# Patient Record
Sex: Male | Born: 1960 | Race: White | Hispanic: No | Marital: Married | Smoking: Former smoker
Health system: Southern US, Community
[De-identification: ages and names within clinical notes are randomized; demographics above are authoritative.]

## PROBLEM LIST (undated history)

## (undated) DIAGNOSIS — C801 Malignant (primary) neoplasm, unspecified: Secondary | ICD-10-CM

## (undated) DIAGNOSIS — R569 Unspecified convulsions: Secondary | ICD-10-CM

## (undated) DIAGNOSIS — I10 Essential (primary) hypertension: Secondary | ICD-10-CM

## (undated) HISTORY — PX: BRAIN SURGERY: SHX531

## (undated) HISTORY — PX: RETINAL DETACHMENT SURGERY: SHX105

## (undated) HISTORY — PX: ABDOMINAL SURGERY: SHX537

## (undated) HISTORY — PX: OTHER SURGICAL HISTORY: SHX169

---

## 1998-03-26 ENCOUNTER — Ambulatory Visit (HOSPITAL_COMMUNITY): Admission: RE | Admit: 1998-03-26 | Discharge: 1998-03-26 | Payer: Self-pay | Admitting: Gynecology

## 2005-05-16 ENCOUNTER — Encounter: Admission: RE | Admit: 2005-05-16 | Discharge: 2005-05-16 | Payer: Self-pay | Admitting: Neurology

## 2008-09-20 ENCOUNTER — Emergency Department (HOSPITAL_BASED_OUTPATIENT_CLINIC_OR_DEPARTMENT_OTHER): Admission: EM | Admit: 2008-09-20 | Discharge: 2008-09-20 | Payer: Self-pay | Admitting: Emergency Medicine

## 2008-09-20 ENCOUNTER — Ambulatory Visit: Payer: Self-pay | Admitting: Diagnostic Radiology

## 2008-09-22 ENCOUNTER — Telehealth: Payer: Self-pay | Admitting: Critical Care Medicine

## 2008-09-23 ENCOUNTER — Encounter
Admission: RE | Admit: 2008-09-23 | Discharge: 2008-09-23 | Payer: Self-pay | Admitting: Thoracic Surgery (Cardiothoracic Vascular Surgery)

## 2008-09-23 ENCOUNTER — Ambulatory Visit: Payer: Self-pay | Admitting: Thoracic Surgery (Cardiothoracic Vascular Surgery)

## 2008-09-23 ENCOUNTER — Telehealth (INDEPENDENT_AMBULATORY_CARE_PROVIDER_SITE_OTHER): Payer: Self-pay | Admitting: *Deleted

## 2008-10-09 ENCOUNTER — Other Ambulatory Visit
Admission: RE | Admit: 2008-10-09 | Discharge: 2008-10-09 | Payer: Self-pay | Admitting: Thoracic Surgery (Cardiothoracic Vascular Surgery)

## 2008-10-09 ENCOUNTER — Encounter
Admission: RE | Admit: 2008-10-09 | Discharge: 2008-10-09 | Payer: Self-pay | Admitting: Thoracic Surgery (Cardiothoracic Vascular Surgery)

## 2008-10-09 ENCOUNTER — Encounter: Payer: Self-pay | Admitting: Thoracic Surgery (Cardiothoracic Vascular Surgery)

## 2008-10-09 ENCOUNTER — Ambulatory Visit: Payer: Self-pay | Admitting: Thoracic Surgery (Cardiothoracic Vascular Surgery)

## 2008-10-16 ENCOUNTER — Encounter
Admission: RE | Admit: 2008-10-16 | Discharge: 2008-10-16 | Payer: Self-pay | Admitting: Thoracic Surgery (Cardiothoracic Vascular Surgery)

## 2008-10-16 ENCOUNTER — Ambulatory Visit: Payer: Self-pay | Admitting: Thoracic Surgery (Cardiothoracic Vascular Surgery)

## 2008-10-22 ENCOUNTER — Ambulatory Visit (HOSPITAL_COMMUNITY)
Admission: RE | Admit: 2008-10-22 | Discharge: 2008-10-22 | Payer: Self-pay | Admitting: Thoracic Surgery (Cardiothoracic Vascular Surgery)

## 2008-11-07 ENCOUNTER — Ambulatory Visit: Payer: Self-pay | Admitting: Thoracic Surgery (Cardiothoracic Vascular Surgery)

## 2008-11-07 ENCOUNTER — Encounter
Admission: RE | Admit: 2008-11-07 | Discharge: 2008-11-07 | Payer: Self-pay | Admitting: Thoracic Surgery (Cardiothoracic Vascular Surgery)

## 2008-11-20 ENCOUNTER — Encounter: Admission: RE | Admit: 2008-11-20 | Discharge: 2008-11-20 | Payer: Self-pay | Admitting: Nephrology

## 2008-11-21 ENCOUNTER — Ambulatory Visit: Payer: Self-pay | Admitting: Thoracic Surgery (Cardiothoracic Vascular Surgery)

## 2010-04-29 NOTE — Progress Notes (Signed)
Summary: appt  Phone Note Call from Patient Call back at [phone]   Caller: Patient Call For: wright Summary of Call: pt was in h/p office this weekend was diag with plural effussion. told to sch appt with dr Delford Field this week . dr Delford Field spoke with dr Patrica Duel  Initial call taken by: Rickard Patience,  September 22, 2008 3:45 PM  Follow-up for Phone Call        I added this pt to the schedule in [facility] on Thurs. I used the 9:30 and blocked the 9:45 for more time to give you 30 minutes. The pt was under the impression that he needed to be seen earlier this week, per your conversation with Dr. Patrica Duel. Please let me know. Otherwise, the pt will see you in [facility] onThurs. Follow-up by: Michel Bickers CMA,  September 22, 2008 4:11 PM  Additional Follow-up for Phone Call Additional follow up Details #1::        I told EDP any MD could have seen this pt can he be added to any MDs consult slot  it does not have to be me  Additional Follow-up by: Storm Frisk MD,  September 22, 2008 5:43 PM    Additional Follow-up for Phone Call Additional follow up Details #2::    No other MD has anything sooner than thurs so will leave appt. with PW. Follow-up by: Vernie Murders,  September 23, 2008 8:53 AM

## 2010-04-29 NOTE — Progress Notes (Signed)
Summary: appt  Phone Note Call from Patient Call back at Home Phone [phone]   Caller: Spouse Call For: wright Summary of Call: called back to check and see if anyone could see him sooner.  Nurse never called pt or wife back from yesterday. Initial call taken by: Eugene Gavia,  September 23, 2008 1:56 PM  Follow-up for Phone Call        There are no available appts before Thurs, 09/25/08 with any of our providers. The pt is scheduled to see Dr. Dorris Fetch today and if he feels this pt needs to be worked in sooner than Thurs, I have asked that they have Dr. Dorris Fetch call our office to speak with one of our providers. Follow-up by: Michel Bickers CMA,  September 23, 2008 2:37 PM

## 2010-07-04 LAB — BLOOD GAS, ARTERIAL
Acid-Base Excess: 0.7 mmol/L (ref 0.0–2.0)
Bicarbonate: 24.3 mEq/L — ABNORMAL HIGH (ref 20.0–24.0)
Drawn by: 206361
FIO2: 0.21 %
O2 Saturation: 97.8 %
Patient temperature: 98.6
TCO2: 25.4 mmol/L (ref 0–100)
pCO2 arterial: 35.8 mmHg (ref 35.0–45.0)
pH, Arterial: 7.448 (ref 7.350–7.450)
pO2, Arterial: 87.7 mmHg (ref 80.0–100.0)

## 2010-07-04 LAB — URINALYSIS, ROUTINE W REFLEX MICROSCOPIC
Bilirubin Urine: NEGATIVE
Glucose, UA: NEGATIVE mg/dL
Hgb urine dipstick: NEGATIVE
Ketones, ur: NEGATIVE mg/dL
Nitrite: NEGATIVE
Protein, ur: NEGATIVE mg/dL
Specific Gravity, Urine: 1.023 (ref 1.005–1.030)
Urobilinogen, UA: 0.2 mg/dL (ref 0.0–1.0)
pH: 5.5 (ref 5.0–8.0)

## 2010-07-04 LAB — COMPREHENSIVE METABOLIC PANEL
ALT: 17 U/L (ref 0–53)
AST: 15 U/L (ref 0–37)
Albumin: 3.5 g/dL (ref 3.5–5.2)
Alkaline Phosphatase: 100 U/L (ref 39–117)
BUN: 13 mg/dL (ref 6–23)
CO2: 22 mEq/L (ref 19–32)
Calcium: 9.3 mg/dL (ref 8.4–10.5)
Chloride: 107 mEq/L (ref 96–112)
Creatinine, Ser: 0.95 mg/dL (ref 0.4–1.5)
GFR calc Af Amer: >60 mL/min (ref >60)
GFR calc non Af Amer: >60 mL/min (ref >60)
Glucose, Bld: 102 mg/dL — ABNORMAL HIGH (ref 70–99)
Potassium: 3.4 mEq/L — ABNORMAL LOW (ref 3.5–5.1)
Sodium: 138 mEq/L (ref 135–145)
Total Bilirubin: 0.7 mg/dL (ref 0.3–1.2)
Total Protein: 6.6 g/dL (ref 6.0–8.3)

## 2010-07-04 LAB — CBC
HCT: 42.8 % (ref 39.0–52.0)
Hemoglobin: 14.2 g/dL (ref 13.0–17.0)
MCHC: 33.1 g/dL (ref 30.0–36.0)
MCV: 87.5 fL (ref 78.0–100.0)
Platelets: 365 10*3/uL (ref 150–400)
RBC: 4.89 MIL/uL (ref 4.22–5.81)
RDW: 13.1 % (ref 11.5–15.5)
WBC: 8 10*3/uL (ref 4.0–10.5)

## 2010-07-04 LAB — TYPE AND SCREEN
ABO/RH(D): O POS
Antibody Screen: NEGATIVE

## 2010-07-04 LAB — PROTIME-INR
INR: 0.9 (ref 0.00–1.49)
Prothrombin Time: 12.5 seconds (ref 11.6–15.2)

## 2010-07-04 LAB — APTT: aPTT: 28 seconds (ref 24–37)

## 2010-07-04 LAB — ABO/RH: ABO/RH(D): O POS

## 2010-07-05 LAB — DIFFERENTIAL
Basophils Absolute: 0.1 10*3/uL (ref 0.0–0.1)
Basophils Relative: 1 % (ref 0–1)
Eosinophils Absolute: 0.3 10*3/uL (ref 0.0–0.7)
Eosinophils Relative: 3 % (ref 0–5)
Lymphocytes Relative: 24 % (ref 12–46)
Lymphs Abs: 1.9 10*3/uL (ref 0.7–4.0)
Monocytes Absolute: 0.8 10*3/uL (ref 0.1–1.0)
Monocytes Relative: 10 % (ref 3–12)
Neutro Abs: 4.8 10*3/uL (ref 1.7–7.7)
Neutrophils Relative %: 62 % (ref 43–77)

## 2010-07-05 LAB — COMPREHENSIVE METABOLIC PANEL
ALT: 8 U/L (ref 0–53)
AST: 23 U/L (ref 0–37)
Albumin: 4.3 g/dL (ref 3.5–5.2)
Alkaline Phosphatase: 149 U/L — ABNORMAL HIGH (ref 39–117)
BUN: 12 mg/dL (ref 6–23)
CO2: 31 mEq/L (ref 19–32)
Calcium: 9.7 mg/dL (ref 8.4–10.5)
Chloride: 102 mEq/L (ref 96–112)
Creatinine, Ser: 1 mg/dL (ref 0.4–1.5)
GFR calc Af Amer: >60 mL/min (ref >60)
GFR calc non Af Amer: >60 mL/min (ref >60)
Glucose, Bld: 83 mg/dL (ref 70–99)
Potassium: 4.4 mEq/L (ref 3.5–5.1)
Sodium: 144 mEq/L (ref 135–145)
Total Bilirubin: 0.5 mg/dL (ref 0.3–1.2)
Total Protein: 8.1 g/dL (ref 6.0–8.3)

## 2010-07-05 LAB — CBC
HCT: 43.8 % (ref 39.0–52.0)
Hemoglobin: 14.6 g/dL (ref 13.0–17.0)
MCHC: 33.2 g/dL (ref 30.0–36.0)
MCV: 87.9 fL (ref 78.0–100.0)
Platelets: 414 10*3/uL — ABNORMAL HIGH (ref 150–400)
RBC: 4.99 MIL/uL (ref 4.22–5.81)
RDW: 12.1 % (ref 11.5–15.5)
WBC: 7.9 10*3/uL (ref 4.0–10.5)

## 2010-07-05 LAB — POCT CARDIAC MARKERS
CKMB, poc: <1 ng/mL — ABNORMAL LOW (ref 1.0–8.0)
Myoglobin, poc: 74.5 ng/mL (ref 12–200)
Troponin i, poc: <0.05 ng/mL (ref 0.00–0.09)

## 2010-08-10 NOTE — Assessment & Plan Note (Signed)
OFFICE VISIT   [name], [name]  DOB:  14-Jun-1960                                        October 16, 2008  CHART #:  [id number]   The patient is a 50 year old gentleman who I first saw in the office on  June 29.  He had presented to the emergency room at [facility]  complaining of chest pain that is pleuritic in nature, he had a right  pleural effusion.  We treated him with antibiotics and nonsteroidal anti-  inflammatories.  He had some improvement initially and then started  feeling more congested.  I saw him back in the office last week and his  effusion was essentially unchanged or to slightly larger.  We did a  thoracentesis, we got about 300 mL of fluid and then gave him a steroid  taper from 25 to 5 mg over 5 days.  He subsequently had significant  improvement of symptoms once again, but then yesterday, he noticed  increasing pain and some shortness of breath but primarily increasing  discomfort.  He has not had any fevers, chills or sweats.   PHYSICAL EXAMINATION:  GENERAL:  The patient is a 50 year old white male  in no acute distress.  VITAL SIGNS:  Blood pressure is 132/82, pulse 89, respirations 18, his  oxygen saturation is 95% on room air.  LUNGS:  Diminished breath sounds at the right base.  No wheezing.   Chest x-ray is reviewed, it shows partial reaccumulation of pleural  fluid.  There was some very slight decrease in size from the pre to  postthoracentesis films on the 15th and there has been a slight increase  in size since the post thoracentesis film.   LABORATORY DATA:  Cytology showed acute inflammation.  No malignancy.  White count is 6000 with 75% neutrophils.  Glucose is 45, protein is  5.3, LDH was 640.  Cultures were no growth.   IMPRESSION:  The patient is a 50 year old gentleman with persistent  exudative inflammatory right pleural effusion, this is partially  loculated and I do not think additional thoracentesis will do [place]  any  good.  I offered him the option of proceeding with VATS for definitive  drainage and leave a chest tube until the drainage stops and have  definitive control of the situation.  We can also do some additional  biopsies at that time.  I discussed with him the risks and benefits of  that approach, expected hospital stay and overall recovery.  He was very  reluctant to proceed down that pathway, I discussed an alternative by  much higher doses of steroid taper to try and decrease the inflammation  significantly to see if we can get the fluid resolved once in for all.  We gave him prescription for prednisone taper starting with 60 mg daily  for 3 days and then every 3 days, tapering by 10 mg until he is done.  I  did warn him about potential side effects including appetite and anxiety  or felling jittery.  He is going to talk to his wife about these options  and decide how to proceed.  He will let our office know if he wants to  proceed with surgery.  I did tell that the surgery would be more  definitive and there is a potential that he will still require  surgery  if he were to feel to have the symptoms resolved with higher doses of  steroids.   [name] [name] [name] [name], M.D.  Electronically Signed   SCH/MEDQ  D:  10/16/2008  T:  10/16/2008  Job:  [id number]   cc:   [name] [name]. [name] [name], M.D.

## 2010-08-10 NOTE — Consult Note (Signed)
NEW PATIENT CONSULTATION   [name] [name], [name] [name]  DOB:  1960/06/16                                        September 23, 2008  CHART #:  [id number]   The patient is a 50 year old gentleman, who was sent for consultation  after presenting to the emergency room at [facility] complaining of  chest pain.  The chest x-ray showed a right pleural effusion.  CT was  done to rule out pulmonary embolus and he was found to have a right  pleural effusion and some mild adenopathy.  The patient states that he  began having problems about 2 weeks ago, and he was having chest pain,  particularly with a deep breath or with cough.  He did have a cough that  was nonproductive and occasionally would have some scant clear sputum,  but not a large amount.  He was unsure if he was having fevers, but he  was having a lot of chills.  He was not particularly having night  sweats.  He did notice some position now that he would have more trouble  breathing when he was lying on his back and a couple of nights he really  could not sleep at all and also would have this migrating pain  sensitivity his chest.  He was seen by his doctor at one point and felt  to have a viral syndrome.  He had been taking ibuprofen, which helped  somewhat with his pain.  He did not take any antibiotics.   He states that since he was seen in the emergency room on Saturday, on  Sunday, his pain was much worse but yesterday he had almost no pain.  Today, his breathing feels somewhat better, but he is once again having  some pain.   His past medical history is significant for hypercholesterolemia.   Current medications are Percocet p.r.n. and ibuprofen p.r.n.   He has no known drug allergies.   SOCIAL HISTORY:  He is married.  He is an Retail buyer.  He is very  active.  He coaches cross-country.  He does not smoke.   Family history is noncontributory.   REVIEW OF SYSTEMS:  Chest pain as described, shortness of breath  with  lying flat or with exertion, muscle pain in chest and back.  All other  systems are negative.   PHYSICAL EXAMINATION:  GENERAL:  The patient is a well-appearing 48-year-  old white male in no acute distress.  VITAL SIGNS:  Blood pressure 133/90, pulse 90, respirations are 18,  oxygen saturation 95% on room air, his temperature is 98.2.  NEUROLOGIC:  He is alert and oriented x3 with no focal deficits.  HEENT:  Unremarkable.  NECK:  Supple without thyromegaly, adenopathy, or bruits.  There is no  cervical or supraclavicular adenopathy.  CARDIAC:  Regular rate and rhythm.  Normal S1 and S2.  No rubs, murmurs,  or gallops.  LUNGS:  Diminished breath sounds with dullness to percussion at the  right base, otherwise clear.   His CT scan is reviewed as well as his chest x-ray from today, which  shows a small right pleural effusion and some very mild hilar  adenopathy.   IMPRESSION:  The patient is a 50 year old gentleman, who presents with  pleuritic chest pain.  He has a small effusion and some mild adenopathy.  This most likely is inflammatory in nature, probably parapneumonic  effusion.  He does not have the typical course for pneumonia, but could  have something like mycoplasma that resulted in the inflammation in  fluid.  I have reviewed our options.  I recommended him that we treat  him with antibiotics and nonsteroidal anti-inflammatories and he can  continue to use the Percocet for pain and then repeat a chest x-ray in  about 2 weeks to make sure this fluid is resolving.  The other option  would be to do a thoracentesis on the fluid today, although I think  there is a small amount of fluid that this will likely resolve with time  and anti-inflammatories.  He was in agreement with a trial of  antibiotics, anti-inflammatories.  I gave him a prescription for  Zithromax as well.  He will take Aleve twice daily for 10 days and then  I will plan to see him back in 2 weeks.  We will  repeat his chest x-ray.  If he still has fluid at that point, we will push for a thoracentesis.   [name] [name] Dorris Fetch, M.D.  Electronically Signed   SCH/MEDQ  D:  09/23/2008  T:  09/24/2008  Job:  [id number]   cc:   [name] [name], M.D.  Charlcie Cradle Delford Field, MD, FCCP

## 2010-08-10 NOTE — Assessment & Plan Note (Signed)
OFFICE VISIT   DEGROFF, Valdis  DOB:  07/21/1960                                        October 09, 2008  CHART #:  [id number]   The patient is a 50 year old gentleman, who I previously saw in the  office on September 23, 2008, after he presented to the emergency room at  [facility] complaining of chest pain.  Chest x-ray showed a right  pleural effusion.  He had a CT which showed an effusion and some mild  adenopathy.  It did rule out pulmonary embolus.  I saw him on the September 23, 2008, at that time we decided to treat him with antibiotics as well  as nonsteroidal anti-inflammatories.  He had a prescription for  Zithromax and he was also taking Aleve twice daily for 10 days.  During  that time his symptoms significantly improved but did not completely  resolve.  He still gets short of breath if he walks from air  conditioning into the heat.  He still is having some cough.  He feels  congested and also still has some mild right-sided pain, although it is  much improved from previously.   PHYSICAL EXAMINATION:  GENERAL:  The patient is a well-appearing 48-year-  old gentleman in no acute distress.  VITAL SIGNS:  His blood pressure is 130/89, pulse 82, respirations are  16, and his oxygen saturation is 96% on room air.  LUNGS:  Diminished breath sounds at the right base.  There is dullness  to percussion.  There is no palpable adenopathy.   Chest x-ray is reviewed.  It shows slight improvement, but there still  is significant effusion present on the right side.   IMPRESSION:  The patient is a 50 year old gentleman with a right pleural  effusion, which is likely inflammatory in nature.  He did have a  prodrome leading up to it and likely had an atypical pneumonia.  He was  treated with Zithromax and nonsteroidals with improvement in symptoms,  but not resolution.  There has also been slight improvement but not  resolution of his pleural effusion.  I discussed  with him our options.  I would recommend being more aggressive at this point and sampling the  fluid both for testing as well as hopefully to relieve some of the  pressure.  I am not sure based on the chest x-ray appearance that this  will be able to be tapped completely, but I do think it would be  important to do so at this point in time.  Our options would be to  proceed with thoracentesis in the office under local anesthetic versus  going ahead with VATS to do biopsies, drain the fluid and establish  drainage in that fashion.  He wishes to proceed with thoracentesis.  We  did discuss the risks including bleeding and pneumothorax.  He  understands and accepts risks and agrees to proceed.   PROCEDURE NOTE:  Using 1% local anesthetic and sterile technique, a  right thoracentesis was performed.  Approximately 300 mL of fluid was  removed.  This was straw-colored.  Specimen will be sent for cytology,  cell count differential, gram stain, and culture as well as protein,  glucose, and LVH measurement.  The patient did have some nausea and  lightheadedness which resolved within minutes after completion of the  procedure.   In addition to the thoracentesis, I am also going to give him a  prednisone taper.  We will do a 5-day taper with that and then I will  plan to see him back next week with an x-ray.  We will send him down for  an x-ray post thoracentesis to rule out pneumothorax.   Salvatore Decent Dorris Fetch, M.D.  Electronically Signed   SCH/MEDQ  D:  10/09/2008  T:  10/10/2008  Job:  [id number]   cc:   Bryan Lemma. Manus Gunning, M.D.  Charlcie Cradle Delford Field, MD, FCCP

## 2010-08-10 NOTE — Assessment & Plan Note (Signed)
OFFICE VISIT   SIEVERS, Kelley  DOB:  Jun 14, 1960                                        November 21, 2008  CHART #:  [id number]   The patient is a 50 year old gentleman who has been seen in the office  over the past 2 months.  He presented with a congested feeling in his  chest and right pleuritic chest pain.  He had an effusions, some mild  adenopathy.  We had done a thoracentesis and also had treated him with  steroids on a couple of occasions.  We actually had him schedule for a  video-assisted thoracoscopy to drain the effusion, but tried a trial of  high-dose steroids, which he responded to.  He was last seen in the  office 2 weeks ago, which was 2 weeks after his cancelled surgery and he  was feeling better at that time and there was no recurrence of his  effusion.  We did a repeat CT scan now, 8 weeks from his original scan  to assess the lymph node to see if the lymphadenopathy had resolved.   The patient states that he still even though his symptoms are far better  than they were 2 months ago, he still has not completely gotten over  this congested or cold in his chest.  He describes that sometimes it is  a congested feeling, sometimes it is tightness.  It is not exertional in  nature and does not sound consistent with angina.  He also continues to  have some pleuritic component of chest pain, although it is not nearly  as sharp pain as it was.  It is more dull and achy, relieved with Advil,  but he is just concerned that he is not completely well at this point in  time.   PHYSICAL EXAMINATION:  GENERAL:  The patient is an anxious, but well-  appearing 49 year old male in no acute distress.  VITAL SIGNS:  His  blood pressure is 121/80, pulse 76, respirations 18, his oxygen  saturation 98% on room air.  Exam is otherwise unchanged.   CT of the chest shows near-complete resolution of the pleural effusion.  There is still pleural thickening on that  side.  There are 2 tiny left  pleural nodules.  He is not at high risk for lung cancer, so no followup  is recommended according to the Fleischner Society.  There has been a  decrease in the adenopathy in the right hilum and mediastinum.   IMPRESSION:  The patient is a 50 year old gentleman, who presented about  2 months ago with a right pleural effusion, some very borderline  adenopathy, and severe symptoms.  He had been ill for 3-4 weeks prior to  this being discovered.  We had done a thoracentesis, which only resulted  in about 300 mL of fluid.  Cytologies were negative with inflammatory.  We treated him with steroids, but he has had persistent symptoms and  persistent fluid and we had him scheduled for video-assisted  thoracoscopy, but the high-dose steroid taper resulted in the effusion  essentially resolving before we could operate on him, and now he is out  a month from that, and has not had a recurrence of his pleural effusion.  His adenopathy has gotten smaller.  He has 2 tiny nodules in the left  lung, which according  to the radiologist do not need any followup given  that he is a low-risk patient for lung cancer.  However, he still does  not feel 100%.  He still does not have his normal amount of energy.  He  still has some right-sided discomfort.  He still has this congestion or  tightness in his chest.  Again, from extensive discussions with him, he  does not think that that there is any exertional component.  This does  not sound like angina.   My recommendation to him was that we send him back to Dr. Manus Gunning to  have him sort of start from scratch with working up his symptoms that  may be that these symptoms just need a little more time to resolve.  This certainly are improved from when I first saw him.  I did offer to  arrange an appointment with Dr. Delford Field of Pulmonology to see if he had  anything further to add.  He wishes to wait and talk this over with his  wife  before deciding how to proceed.  He may just decide to give it a  little more time.  We would be happy to help in any way we can.   Salvatore Decent Dorris Fetch, M.D.  Electronically Signed   SCH/MEDQ  D:  11/21/2008  T:  11/22/2008  Job:  [id number]

## 2010-08-10 NOTE — Assessment & Plan Note (Signed)
OFFICE VISIT   OBOYLE, Arney  DOB:  Dec 08, 1960                                        November 07, 2008  CHART #:  [id number]   The patient is a 50 year old gentleman who I have been seeing in the  office for the past 6 weeks.  He had presented with a right pleural  effusion which was causing cough, chest pain and shortness of breath.  We treated him initially with thoracentesis and steroids, but he  continued to have problems.  We had actually had him scheduled at one  point for VATS, but tried high dose steroid taper.  When he came in for  his VATS, his effusion was essentially resolved and so cancelled the  surgery.  He has now completed a steroid taper and comes in for follow  up.  He says that he still has some bruised feeling in the posterior  right chest and still has some feeling of congestion centrally, but has  more energy and overall feels much better, even the symptoms that  persist are much improved.   PHYSICAL EXAMINATION:  GENERAL:  Today, he is a 50 year old gentleman in  no acute distress.  VITAL SIGNS:  Blood pressure is 138/86, pulse 88, respirations 18, his  oxygen saturation is 97% on room air.  LUNGS:  Clear to auscultation and percussion.  There is no rub or  wheezing.   His chest x-ray today shows essentially completely resolved right  pleural effusion.   IMPRESSION:  Resolving pleurisy.  The one remaining question that is  still unanswered at this point is the adenopathy that was seen on the  initial CT scan.  I suspect this was all just inflammatory related, but  for completeness sake, we do need to follow that up with a repeat CT  scan to evaluate the lymph nodes.  We plan on doing that in 2 weeks,  that will give [place] 3 weeks  off steroids and 8 weeks from his original CT scan to see if there is  any change.  Overall, he is doing well.  He will call if he develops  worsening of his symptoms.   Salvatore Decent Dorris Fetch, M.D.  Electronically Signed   SCH/MEDQ  D:  11/07/2008  T:  11/07/2008  Job:  [id number]   cc:   Bryan Lemma. Manus Gunning, M.D.

## 2011-09-06 ENCOUNTER — Emergency Department (HOSPITAL_COMMUNITY): Admission: EM | Admit: 2011-09-06 | Payer: Self-pay | Source: Home / Self Care

## 2011-09-06 ENCOUNTER — Encounter (HOSPITAL_COMMUNITY): Payer: Self-pay | Admitting: *Deleted

## 2011-09-06 HISTORY — DX: Essential (primary) hypertension: I10

## 2011-09-06 HISTORY — DX: Unspecified convulsions: R56.9

## 2011-09-06 HISTORY — DX: Malignant (primary) neoplasm, unspecified: C80.1

## 2011-09-06 NOTE — ED Notes (Signed)
Pain lt shoulder , neck , onset 2 hours ago.  Sharp pain,Pt says there is a "knot" at lt clavicle. That he can feel when he breathes

## 2012-08-10 ENCOUNTER — Other Ambulatory Visit: Payer: Self-pay | Admitting: Dermatology

## 2013-05-31 ENCOUNTER — Other Ambulatory Visit: Payer: Self-pay | Admitting: Family Medicine

## 2013-05-31 DIAGNOSIS — H532 Diplopia: Secondary | ICD-10-CM

## 2013-05-31 DIAGNOSIS — H5712 Ocular pain, left eye: Secondary | ICD-10-CM

## 2013-06-01 ENCOUNTER — Ambulatory Visit
Admission: RE | Admit: 2013-06-01 | Discharge: 2013-06-01 | Disposition: A | Discharge status: Home / Self Care | Payer: BC Managed Care – PPO | Source: Ambulatory Visit | Attending: Family Medicine | Admitting: Family Medicine

## 2013-06-01 DIAGNOSIS — H532 Diplopia: Secondary | ICD-10-CM

## 2013-06-01 DIAGNOSIS — H5712 Ocular pain, left eye: Secondary | ICD-10-CM

## 2013-06-01 MED ORDER — GADOBENATE DIMEGLUMINE 529 MG/ML IV SOLN
20.0000 mL | Freq: Once | INTRAVENOUS | Status: AC | PRN
  Administered 2013-06-01: 20 mL via INTRAVENOUS

## 2013-06-05 ENCOUNTER — Encounter: Payer: Self-pay | Admitting: Neurology

## 2013-06-06 ENCOUNTER — Encounter (INDEPENDENT_AMBULATORY_CARE_PROVIDER_SITE_OTHER): Payer: Self-pay

## 2013-06-06 ENCOUNTER — Encounter: Payer: Self-pay | Admitting: Neurology

## 2013-06-06 ENCOUNTER — Ambulatory Visit (INDEPENDENT_AMBULATORY_CARE_PROVIDER_SITE_OTHER): Payer: BC Managed Care – PPO | Admitting: Neurology

## 2013-06-06 VITALS — BP 141/89 | HR 81 | Ht 73.5 in | Wt 232.0 lb

## 2013-06-06 DIAGNOSIS — H4920 Sixth [abducent] nerve palsy, unspecified eye: Secondary | ICD-10-CM

## 2013-06-06 NOTE — Progress Notes (Signed)
[facility]    Provider:  Dr Janann Colonel Referring Provider: Fabio Pierce, MD Primary Care Physician:  Simona Huh, MD  CC:  Headache and abnormal eye movements  HPI:  [name] [name] is a 53 y.o. male here as a referral from Dr. Delman Cheadle for new onset 6th nerve palsy and headache  Reports symptoms began around 1-2 weeks ago, initially noted some "wavy and dizzy" sensation when looking to the left. Few days after developed monocular diplopia. When looking with his left eye only the double vision resolves but continues to have nausea and dizzy sensation. Recently developed a left retro-orbital pain, described as a dull aching pain. Symptoms have not improved or progressed over the past few weeks. Was evaluated by eye doctor and PCP, had normal MRI brain w/ and w/out contrast, normal MG and thyroid lab studies.   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 MRI brain at that time was unremarkable. Of note, he has removed tics from his dog recently and dog is currently being treated for lyme disease. He notes no recent rashes.   Had MRI brain completed which was unremarkable (imaging reviewed).   Review of Systems: Out of a complete 14 system review, the patient complains of only the following symptoms, and all other reviewed systems are negative. + blurred vision, double vision, eye pain, headache  History   Social History  . Marital Status: Married    Spouse Name: [name]    Number of Children: 2  . Years of Education: Master's   Occupational History  .  [employer] History Main Topics  . Smoking status: Former Research scientist (life sciences)  . Smokeless tobacco: Not on file  . Alcohol Use: No  . Drug Use: No  . Sexual Activity: Not on file   Other Topics Concern  . Not on file   Social History Narrative   Patient is married to [name]), has 2 children   Patient is right handed   Education level is Master's degree   Caffeine  consumption is 2-4 cups daily    Family History  Problem Relation Age of Onset  . Stroke Paternal Grandfather   . Heart disease Maternal Grandfather   . Hypertension Maternal Grandmother   . High Cholesterol Father   . Diabetes Father   . Migraines Mother     Past Medical History  Diagnosis Date  . Seizures   . Hypertension   . Cancer     Past Surgical History  Procedure Laterality Date  . Brain surgery    . Abdominal surgery      Current Outpatient Prescriptions  Medication Sig Dispense Refill  . etodolac (LODINE) 400 MG tablet       . HYDROcodone-acetaminophen (NORCO/VICODIN) 5-325 MG per tablet        No current facility-administered medications for this visit.    Allergies as of 06/06/2013 - Review Complete 06/06/2013  Allergen Reaction Noted  . Erythromycin  09/06/2011    Vitals: BP 141/89  Pulse 81  Ht 6' 1.5" (1.867 m)  Wt 232 lb (105.235 kg)  BMI 30.19 kg/m2 Last Weight:  Wt Readings from Last 1 Encounters:  06/06/13 232 lb (105.235 kg)   Last Height:   Ht Readings from Last 1 Encounters:  06/06/13 6' 1.5" (1.867 m)     Physical exam: Exam: Gen: NAD, conversant Eyes: anicteric sclerae, moist conjunctivae HENT: Atraumatic, oropharynx clear Neck: Trachea midline; supple,  Lungs: CTA, no wheezing, rales,  rhonic                          CV: RRR, no MRG Abdomen: Soft, non-tender;  Extremities: No peripheral edema  Skin: Normal temperature, no rash,  Psych: Appropriate affect, pleasant  Neuro: MS: AA&Ox3, appropriately interactive, normal affect   Speech: fluent w/o paraphasic error  Memory: good recent and remote recall  CN: PERRL, EOMI no nystagmus, OS baseline nasal deviation, left lateral rectus palsy, no ptosis (left eye lid closed but easily able to open when instructed), sensation intact to LT V1-V3 bilat, face symmetric, no weakness, hearing grossly intact, palate elevates symmetrically, shoulder shrug 5/5 bilat,  tongue protrudes  midline, no fasiculations noted.  Motor: normal bulk and tone Strength: 5/5  In all extremities  Coord: rapid alternating and point-to-point (FNF, HTS) movements intact.  Reflexes: symmetrical, bilat downgoing toes  Sens: LT intact in all extremities  Gait: posture, stance, stride and arm-swing normal. Tandem gait intact. Able to walk on heels and toes. Romberg absent.   Assessment:  After physical and neurologic examination, review of laboratory studies, imaging, neurophysiology testing and pre-existing records, assessment will be reviewed on the problem list.  Plan:  Treatment plan and additional workup will be reviewed under Problem List.  1)Left CN 6 palsy  53y/o gentleman presenting for initial evaluation of an isolated OS CN 6 palsy of unclear etiology. MRI brain, MG and TSH studies are unremarkable. Will check lab work, including ESR, CRP, ANA, HbA1c and Lyme studies. Will check brain MRA to rule out vascular cause of his symptoms. Follow up once workup completed. Would consider lumbar puncture if above workup negative.   Jim Like, DO  [facility] [address] [place], [place] [address]  Phone [phone] Fax [phone]

## 2013-06-07 ENCOUNTER — Telehealth: Payer: Self-pay | Admitting: Neurology

## 2013-06-07 ENCOUNTER — Other Ambulatory Visit: Payer: Self-pay | Admitting: Neurology

## 2013-06-07 DIAGNOSIS — H4920 Sixth [abducent] nerve palsy, unspecified eye: Secondary | ICD-10-CM

## 2013-06-07 LAB — LYME, TOTAL AB TEST/REFLEX: Lyme IgG/IgM Ab: <0.91 {ISR} (ref 0.00–0.90)

## 2013-06-07 LAB — ANA W/REFLEX IF POSITIVE
Anti JO-1: <0.2 AI (ref 0.0–0.9)
Anti Nuclear Antibody(ANA): POSITIVE — AB
Centromere Ab Screen: <0.2 AI (ref 0.0–0.9)
Chromatin Ab SerPl-aCnc: <0.2 AI (ref 0.0–0.9)
ENA RNP Ab: 1.2 AI — ABNORMAL HIGH (ref 0.0–0.9)
ENA SM Ab Ser-aCnc: <0.2 AI (ref 0.0–0.9)
ENA SSA (RO) Ab: <0.2 AI (ref 0.0–0.9)
ENA SSB (LA) Ab: 4.9 AI — ABNORMAL HIGH (ref 0.0–0.9)
Scleroderma SCL-70: <0.2 AI (ref 0.0–0.9)
dsDNA Ab: <1 IU/mL (ref 0–9)

## 2013-06-07 LAB — HEMOGLOBIN A1C
Est. average glucose Bld gHb Est-mCnc: 114 mg/dL
Hgb A1c MFr Bld: 5.6 % (ref 4.8–5.6)

## 2013-06-07 LAB — SEDIMENTATION RATE: Sed Rate: 4 mm/hr (ref 0–30)

## 2013-06-07 LAB — C-REACTIVE PROTEIN: CRP: 2.4 mg/L (ref 0.0–4.9)

## 2013-06-07 NOTE — Telephone Encounter (Signed)
Called patient back to discuss results. Will refer to rheumatology. Will hold off on MRA at this time pending rheum workup

## 2013-06-13 ENCOUNTER — Other Ambulatory Visit: Payer: BC Managed Care – PPO

## 2013-06-24 ENCOUNTER — Other Ambulatory Visit: Payer: Self-pay | Admitting: *Deleted

## 2013-06-24 DIAGNOSIS — H4920 Sixth [abducent] nerve palsy, unspecified eye: Secondary | ICD-10-CM

## 2013-06-24 NOTE — Addendum Note (Signed)
Addended byOliver Hum on: 06/24/2013 03:48 PM   Modules accepted: Orders

## 2013-07-04 ENCOUNTER — Telehealth: Payer: Self-pay | Admitting: *Deleted

## 2013-07-04 NOTE — Telephone Encounter (Signed)
[name] [name] at Dr Delman Cheadle office , Need to know why patient needs to see a Rheumatologist. Telephone # [phone] call her back.

## 2013-07-05 ENCOUNTER — Telehealth: Payer: Self-pay | Admitting: Neurology

## 2013-07-05 NOTE — Telephone Encounter (Signed)
Jeani Hawking at Dr Delman Cheadle office calling back, called on 07/04/13 needs to know why patient needs to see a Rheumatologist.  Jeani Hawking would like a call back, she also states their office will close at 1 today if you don't get her before then need to call her back Monday @ [phone]

## 2013-07-08 NOTE — Telephone Encounter (Addendum)
[name] [name] from Dr Maurie Boettcher office is requesting recent labs,imaging studies be faxed to their office [phone]) so that they may schedule patient to see Rheumatologist

## 2019-05-25 ENCOUNTER — Ambulatory Visit: Payer: BC Managed Care – PPO | Attending: Internal Medicine

## 2019-05-25 DIAGNOSIS — Z23 Encounter for immunization: Secondary | ICD-10-CM | POA: Insufficient documentation

## 2019-05-25 NOTE — Progress Notes (Signed)
   Covid-19 Vaccination Clinic  Name:  [name] [name]    MRN: [id number] DOB: November 25, 1960  05/25/2019  [name] was observed post Covid-19 immunization for 15 minutes without incidence. He was provided with Vaccine Information Sheet and instruction to access the V-Safe system.   [name] was instructed to call 911 with any severe reactions post vaccine: [name] Difficulty breathing  . Swelling of your face and throat  . A fast heartbeat  . A bad rash all over your body  . Dizziness and weakness    Immunizations Administered    Name Date Dose VIS Date Route   Pfizer COVID-19 Vaccine 05/25/2019 11:02 AM 0.3 mL 03/08/2019 Intramuscular   Manufacturer: [place]   Lot: [id number]   [place]: [id number]

## 2019-06-15 ENCOUNTER — Ambulatory Visit: Payer: BC Managed Care – PPO | Attending: Internal Medicine

## 2019-06-15 DIAGNOSIS — Z23 Encounter for immunization: Secondary | ICD-10-CM

## 2019-06-15 NOTE — Progress Notes (Signed)
   Covid-19 Vaccination Clinic  Name:  [name] [name]    MRN: [id number] DOB: 22-Oct-1960  06/15/2019  [name] was observed post Covid-19 immunization for 15 minutes without incident. He was provided with Vaccine Information Sheet and instruction to access the V-Safe system.   [name] was instructed to call 911 with any severe reactions post vaccine: [name] Difficulty breathing  . Swelling of face and throat  . A fast heartbeat  . A bad rash all over body  . Dizziness and weakness   Immunizations Administered    Name Date Dose VIS Date Route   Pfizer COVID-19 Vaccine 06/15/2019 12:14 PM 0.3 mL 03/08/2019 Intramuscular   Manufacturer: [place]   Lot: [id number]   [place]: [id number]

## 2019-06-19 ENCOUNTER — Ambulatory Visit: Payer: BC Managed Care – PPO

## 2020-04-13 ENCOUNTER — Ambulatory Visit: Payer: BC Managed Care – PPO | Admitting: Podiatry

## 2020-04-27 ENCOUNTER — Ambulatory Visit (INDEPENDENT_AMBULATORY_CARE_PROVIDER_SITE_OTHER): Payer: Self-pay | Admitting: Podiatry

## 2020-04-27 ENCOUNTER — Encounter: Payer: Self-pay | Admitting: Podiatry

## 2020-04-27 ENCOUNTER — Other Ambulatory Visit: Payer: Self-pay

## 2020-04-27 DIAGNOSIS — B351 Tinea unguium: Secondary | ICD-10-CM

## 2020-04-27 MED ORDER — TERBINAFINE HCL 250 MG PO TABS: 250.0000 mg | ORAL_TABLET | Freq: Every day | ORAL | 0 refills | Status: DC

## 2020-04-27 NOTE — Progress Notes (Signed)
Subjective:   Patient ID: [name] [name], male   DOB: 60 y.o.   MRN: [id number]   HPI Patient states he is having trouble with his left big toenail being thickened and stated he had issues with nail fungus many years ago was prescribed medicine and it only helped periodically.  States that he is concerned about the nail and would like treatment if possible.  Patient does not smoke likes to be active   Review of Systems  All other systems reviewed and are negative.       Objective:  Physical Exam Vitals and nursing note reviewed.  Constitutional:      Appearance: He is well-developed and well-nourished.  Cardiovascular:     Pulses: Intact distal pulses.  Pulmonary:     Effort: Pulmonary effort is normal.  Musculoskeletal:        General: Normal range of motion.  Skin:    General: Skin is warm.  Neurological:     Mental Status: He is alert.     Neurovascular status intact muscle strength adequate range of motion within normal limits with the distal two thirds nail bed left yellow with moderate discoloration no proximal edema erythema drainage and moderate discomfort at times with shoe gear.  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  No guarantee we can get this better but he is aggressive and wants treatment if possible and I have recommended utilization of oral antifungal 60 days as he is just had blood work done along with laser therapy and topical antifungal therapy.  I absolutely explained there is no guarantee this will get better but it is the best chance we have of making a big difference

## 2020-05-18 ENCOUNTER — Other Ambulatory Visit: Payer: Self-pay

## 2020-05-18 ENCOUNTER — Ambulatory Visit (INDEPENDENT_AMBULATORY_CARE_PROVIDER_SITE_OTHER): Payer: BC Managed Care – PPO

## 2020-05-18 DIAGNOSIS — B351 Tinea unguium: Secondary | ICD-10-CM

## 2020-05-18 NOTE — Progress Notes (Signed)
Patient presents today for the 1st laser treatment. Diagnosed with mycotic nail infection by Dr. Paulla Dolly.   Toenail most affected Left hallux.  All other systems are negative.  Nails were filed thin. Laser therapy was administered to left hallux toenails. Patient is taking Lamisil orally as well and patient tolerated the treatment well. All safety precautions were in place.    Follow up in 4 weeks for laser # 2.  Please take picture of nails at next visit to document visual progress

## 2020-06-26 ENCOUNTER — Ambulatory Visit (INDEPENDENT_AMBULATORY_CARE_PROVIDER_SITE_OTHER): Payer: BC Managed Care – PPO

## 2020-06-26 ENCOUNTER — Other Ambulatory Visit: Payer: Self-pay

## 2020-06-26 DIAGNOSIS — B351 Tinea unguium: Secondary | ICD-10-CM

## 2020-06-26 NOTE — Progress Notes (Signed)
Patient presents today for the 2nd laser treatment. Diagnosed with mycotic nail infection by Dr. Paulla Dolly.   Toenail most affected Left hallux.  All other systems are negative.  Nails were filed thin. Laser therapy was administered to left hallux toenails. Patient is taking Lamisil orally as well and patient tolerated the treatment well. All safety precautions were in place.    Follow up in 4 weeks for laser # 3.

## 2020-07-24 ENCOUNTER — Ambulatory Visit (INDEPENDENT_AMBULATORY_CARE_PROVIDER_SITE_OTHER): Payer: BC Managed Care – PPO

## 2020-07-24 ENCOUNTER — Other Ambulatory Visit: Payer: Self-pay

## 2020-07-24 DIAGNOSIS — B351 Tinea unguium: Secondary | ICD-10-CM

## 2020-07-24 NOTE — Progress Notes (Signed)
Patient presents today for the 3rd laser treatment. Diagnosed with mycotic nail infection by Dr. Paulla Dolly.   Toenail most affected Left hallux.  All other systems are negative.  Nails were filed thin. Laser therapy was administered to left hallux toenails. Patient is taking Lamisil orally as well and patient tolerated the treatment well. All safety precautions were in place.    Follow up in 4 weeks for laser # 4

## 2020-09-04 ENCOUNTER — Other Ambulatory Visit: Payer: Self-pay

## 2020-09-04 ENCOUNTER — Ambulatory Visit (INDEPENDENT_AMBULATORY_CARE_PROVIDER_SITE_OTHER): Payer: BC Managed Care – PPO

## 2020-09-04 DIAGNOSIS — B351 Tinea unguium: Secondary | ICD-10-CM

## 2020-09-04 NOTE — Progress Notes (Signed)
Patient presents today for the 4th laser treatment. Diagnosed with mycotic nail infection by Dr. Paulla Dolly.   Toenail most affected Left hallux.  All other systems are negative.  Nails were filed thin. Laser therapy was administered to left hallux toenails. Patient is taking Lamisil orally as well and patient tolerated the treatment well. All safety precautions were in place.    Follow up in 4 weeks for evaluation of nail with Dr.Regal.  Then continue with 5th and 6th session if needed.

## 2020-10-02 ENCOUNTER — Ambulatory Visit: Payer: BC Managed Care – PPO | Admitting: Podiatry

## 2020-10-02 ENCOUNTER — Other Ambulatory Visit: Payer: Self-pay

## 2020-10-02 ENCOUNTER — Encounter: Payer: Self-pay | Admitting: Podiatry

## 2020-10-02 DIAGNOSIS — B351 Tinea unguium: Secondary | ICD-10-CM

## 2020-10-02 NOTE — Progress Notes (Signed)
Subjective:   Patient ID: [name] [name], male   DOB: 60 y.o.   MRN: [id number]   HPI Patient presents with stating that the throbbing he used to get has improved and while there is still some nail discoloration it does seem to have moderated.  He has numerous questions concerning future   ROS      Objective:  Physical Exam  Neurovascular status intact with patient's left hallux nail showing improvement with patient still having moderate distal distal discoloration present with no active swelling or drainage associated with it currently     Assessment:  Probability for combination of trauma versus fungal nail left     Plan:  H&P reviewed condition and at this point were going to treat conservatively with continuation of wider shoes padding with consideration long-term for pulse antifungal therapy or possible future laser treatments or removal.  Educated them all the different options and the way we think about this and hopefully this will continue to be improved as its been

## 2020-10-24 ENCOUNTER — Emergency Department (HOSPITAL_COMMUNITY): Payer: BC Managed Care – PPO

## 2020-10-24 ENCOUNTER — Other Ambulatory Visit: Payer: Self-pay

## 2020-10-24 ENCOUNTER — Emergency Department (HOSPITAL_BASED_OUTPATIENT_CLINIC_OR_DEPARTMENT_OTHER): Payer: BC Managed Care – PPO

## 2020-10-24 ENCOUNTER — Emergency Department (HOSPITAL_BASED_OUTPATIENT_CLINIC_OR_DEPARTMENT_OTHER)
Admission: EM | Admit: 2020-10-24 | Discharge: 2020-10-25 | Disposition: A | Discharge status: Home / Self Care | Payer: BC Managed Care – PPO | Attending: Emergency Medicine | Admitting: Emergency Medicine

## 2020-10-24 DIAGNOSIS — Z79899 Other long term (current) drug therapy: Secondary | ICD-10-CM | POA: Diagnosis not present

## 2020-10-24 DIAGNOSIS — I10 Essential (primary) hypertension: Secondary | ICD-10-CM | POA: Insufficient documentation

## 2020-10-24 DIAGNOSIS — R791 Abnormal coagulation profile: Secondary | ICD-10-CM | POA: Insufficient documentation

## 2020-10-24 DIAGNOSIS — R748 Abnormal levels of other serum enzymes: Secondary | ICD-10-CM | POA: Insufficient documentation

## 2020-10-24 DIAGNOSIS — H532 Diplopia: Secondary | ICD-10-CM | POA: Insufficient documentation

## 2020-10-24 DIAGNOSIS — Z859 Personal history of malignant neoplasm, unspecified: Secondary | ICD-10-CM | POA: Insufficient documentation

## 2020-10-24 DIAGNOSIS — H5711 Ocular pain, right eye: Secondary | ICD-10-CM

## 2020-10-24 DIAGNOSIS — R778 Other specified abnormalities of plasma proteins: Secondary | ICD-10-CM

## 2020-10-24 DIAGNOSIS — R519 Headache, unspecified: Secondary | ICD-10-CM | POA: Diagnosis present

## 2020-10-24 LAB — COMPREHENSIVE METABOLIC PANEL
ALT: 21 U/L (ref 0–44)
ALT: 23 U/L (ref 0–44)
AST: 17 U/L (ref 15–41)
AST: 22 U/L (ref 15–41)
Albumin: 4.6 g/dL (ref 3.5–5.0)
Albumin: 3.7 g/dL (ref 3.5–5.0)
Alkaline Phosphatase: 112 U/L (ref 38–126)
Alkaline Phosphatase: 105 U/L (ref 38–126)
Anion gap: 10 (ref 5–15)
Anion gap: 9 (ref 5–15)
BUN: 8 mg/dL (ref 6–20)
BUN: 8 mg/dL (ref 6–20)
CO2: 27 mmol/L (ref 22–32)
CO2: 23 mmol/L (ref 22–32)
Calcium: 10.3 mg/dL (ref 8.9–10.3)
Calcium: 9 mg/dL (ref 8.9–10.3)
Chloride: 100 mmol/L (ref 98–111)
Chloride: 104 mmol/L (ref 98–111)
Creatinine, Ser: 1.03 mg/dL (ref 0.61–1.24)
Creatinine, Ser: 1.02 mg/dL (ref 0.61–1.24)
GFR, Estimated: >60 mL/min (ref >60)
GFR, Estimated: >60 mL/min (ref >60)
Glucose, Bld: 105 mg/dL — ABNORMAL HIGH (ref 70–99)
Glucose, Bld: 114 mg/dL — ABNORMAL HIGH (ref 70–99)
Potassium: 3.7 mmol/L (ref 3.5–5.1)
Potassium: 3.5 mmol/L (ref 3.5–5.1)
Sodium: 137 mmol/L (ref 135–145)
Sodium: 136 mmol/L (ref 135–145)
Total Bilirubin: 0.7 mg/dL (ref 0.3–1.2)
Total Bilirubin: 0.8 mg/dL (ref 0.3–1.2)
Total Protein: 7.9 g/dL (ref 6.5–8.1)
Total Protein: 6.4 g/dL — ABNORMAL LOW (ref 6.5–8.1)

## 2020-10-24 LAB — CBC
HCT: 47 % (ref 39.0–52.0)
HCT: 43.4 % (ref 39.0–52.0)
Hemoglobin: 15.8 g/dL (ref 13.0–17.0)
Hemoglobin: 14.9 g/dL (ref 13.0–17.0)
MCH: 29.7 pg (ref 26.0–34.0)
MCH: 30.5 pg (ref 26.0–34.0)
MCHC: 33.6 g/dL (ref 30.0–36.0)
MCHC: 34.3 g/dL (ref 30.0–36.0)
MCV: 88.3 fL (ref 80.0–100.0)
MCV: 88.8 fL (ref 80.0–100.0)
Platelets: 333 K/uL (ref 150–400)
Platelets: 321 10*3/uL (ref 150–400)
RBC: 5.32 MIL/uL (ref 4.22–5.81)
RBC: 4.89 MIL/uL (ref 4.22–5.81)
RDW: 12.7 % (ref 11.5–15.5)
RDW: 12.4 % (ref 11.5–15.5)
WBC: 7.9 K/uL (ref 4.0–10.5)
WBC: 7.9 10*3/uL (ref 4.0–10.5)
nRBC: 0 % (ref 0.0–0.2)
nRBC: 0 % (ref 0.0–0.2)

## 2020-10-24 LAB — VITAMIN B12: Vitamin B-12: 127 pg/mL — ABNORMAL LOW (ref 180–914)

## 2020-10-24 LAB — PROTIME-INR
INR: 0.9 (ref 0.8–1.2)
INR: 1 (ref 0.8–1.2)
Prothrombin Time: 12.1 s (ref 11.4–15.2)
Prothrombin Time: 12.7 seconds (ref 11.4–15.2)

## 2020-10-24 LAB — TROPONIN I (HIGH SENSITIVITY)
Troponin I (High Sensitivity): 61 ng/L — ABNORMAL HIGH
Troponin I (High Sensitivity): 62 ng/L — ABNORMAL HIGH (ref <18)

## 2020-10-24 LAB — SEDIMENTATION RATE: Sed Rate: 13 mm/h (ref 0–16)

## 2020-10-24 LAB — C-REACTIVE PROTEIN: CRP: <0.5 mg/dL

## 2020-10-24 MED ORDER — GADOBUTROL 1 MMOL/ML IV SOLN
10.0000 mL | Freq: Once | INTRAVENOUS | Status: AC | PRN
  Administered 2020-10-24: 10 mL via INTRAVENOUS

## 2020-10-24 MED ORDER — TETRACAINE HCL 0.5 % OP SOLN
2.0000 [drp] | Freq: Once | OPHTHALMIC | Status: AC
  Administered 2020-10-24: 2 [drp] via OPHTHALMIC
  Filled 2020-10-24: qty 4

## 2020-10-24 MED ORDER — OXYCODONE-ACETAMINOPHEN 5-325 MG PO TABS
2.0000 | ORAL_TABLET | Freq: Once | ORAL | Status: AC
Start: 2020-10-24 — End: 2020-10-24
  Administered 2020-10-24: 2 via ORAL
  Filled 2020-10-24: qty 2

## 2020-10-24 MED ORDER — SODIUM CHLORIDE 0.9 % IV SOLN
1000.0000 mg | Freq: Once | INTRAVENOUS | Status: AC
  Administered 2020-10-24: 1000 mg via INTRAVENOUS
  Filled 2020-10-24: qty 8

## 2020-10-24 MED ORDER — PROCHLORPERAZINE EDISYLATE 10 MG/2ML IJ SOLN
10.0000 mg | INTRAMUSCULAR | Status: DC
  Filled 2020-10-24: qty 2

## 2020-10-24 MED ORDER — ONDANSETRON HCL 4 MG/2ML IJ SOLN
4.0000 mg | Freq: Once | INTRAMUSCULAR | Status: AC
  Administered 2020-10-24: 4 mg via INTRAVENOUS
  Filled 2020-10-24: qty 2

## 2020-10-24 NOTE — ED Notes (Signed)
Attempted 2nd call to [place] charge nurse for report.

## 2020-10-24 NOTE — ED Notes (Signed)
Patient continues to c/o being hungry. Rates right jaw pain 2/10. Denies nausea. Lights off for patient's comfort. Patient's wife remains at bedside.

## 2020-10-24 NOTE — ED Provider Notes (Signed)
[facility] EMERGENCY DEPARTMENT Provider Note   CSN: [id number] Arrival date & time: 10/24/20  1660     History Chief Complaint  Patient presents with   Migraine    [name] [name] is a 60 y.o. 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  Patient reports that she has had headache that appears to be focally located behind his right eye and in the left parietal region for the past week.  It has been persistent every day, comes and goes at times, nothing seems to make it better or worse.  He reports blurred vision at times, specifically double vision.  He denies pain on the surface of the eye but feels there is pain behind the eye.  He otherwise does not suffer from migraines or chronic headaches.  He does report he had an isolated VI nerve palsy of the left eye in the past, but that resolved.  He denies any known history of MS or autoimmune disease.  He denies any objective fevers or chills this week.  At [facility] the patient had a CT scan of the head and a CT maxillofacial scan which was unremarkable.  There is a neurology consult placed and recommended transfer for additional cranial imaging.  On arrival the patient ports he still has a persistent headache behind the right eye, but that improved with the oral medications he received the [facility].  He reports nausea as well, but states he hasn't eaten all day and feels hungry.  He reports hx of HTN and HLD.  Denies known diagnosis of diabetes.  Denies smoking hx.  Denies sig family hx of MI or cardiac disease.  Denies personal hx of cardiac disease, MI, stents, or exertional/anginal symptoms.  He denies chest pain, pressure, SOB.  HPI     Past Medical History:  Diagnosis Date   Cancer ([place])    Hypertension    Seizures ([place])     There are no problems to display for this patient.   Past Surgical History:   Procedure Laterality Date   ABDOMINAL SURGERY     BRAIN SURGERY         Family History  Problem Relation Age of Onset   Stroke Paternal Grandfather    Heart disease Maternal Grandfather    Hypertension Maternal Grandmother    High Cholesterol Father    Diabetes Father    Migraines Mother     Social History   Tobacco Use   Smoking status: Former  Substance Use Topics   Alcohol use: No   Drug use: No    Home Medications Prior to Admission medications   Medication Sig Start Date End Date Taking? Authorizing Provider  amoxicillin-clavulanate (AUGMENTIN) 875-125 MG tablet Take 1 tablet by mouth every 12 (twelve) hours. 10/22/20  Yes [provider]  atorvastatin (LIPITOR) 10 MG tablet Take 10 mg by mouth daily. 03/16/20  Yes [provider]  lisinopril (ZESTRIL) 10 MG tablet Take 10 mg by mouth daily. 04/20/20  Yes [provider]  OVER THE COUNTER MEDICATION Place 1 drop into both eyes in the morning and at bedtime. Medication: Eye Allergy Relief Drops. Pheniramine Maleate 0.315% and Naphazoline Hydrochloride 0.02675% Opth Soln   Yes [provider]    Allergies    Erythromycin  Review of Systems   Review of Systems  Constitutional:  Negative for chills and fever.  HENT:  Negative for ear pain  and sore throat.   Eyes:  Positive for pain and visual disturbance.  Respiratory:  Negative for cough and shortness of breath.   Cardiovascular:  Negative for chest pain and palpitations.  Gastrointestinal:  Positive for nausea. Negative for abdominal pain and vomiting.  Genitourinary:  Negative for dysuria and hematuria.  Musculoskeletal:  Negative for arthralgias and back pain.  Skin:  Negative for color change and rash.  Neurological:  Positive for headaches. Negative for syncope.  All other systems reviewed and are negative.  Physical Exam Updated Vital Signs BP (!) 157/94   Pulse 72   Temp 97.8 F (36.6 C) (Oral)   Resp 20   Ht 6'  (1.829 m)   Wt 108.4 kg   SpO2 96%   BMI 32.41 kg/m   Physical Exam Constitutional:      General: He is not in acute distress. HENT:     Head: Normocephalic and atraumatic.  Eyes:     Conjunctiva/sclera: Conjunctivae normal.     Pupils: Pupils are equal, round, and reactive to light.  Cardiovascular:     Rate and Rhythm: Normal rate and regular rhythm.     Pulses: Normal pulses.  Pulmonary:     Effort: Pulmonary effort is normal. No respiratory distress.  Abdominal:     General: There is no distension.     Tenderness: There is no abdominal tenderness.  Skin:    General: Skin is warm and dry.  Neurological:     Mental Status: He is alert. Mental status is at baseline.     GCS: GCS eye subscore is 4. GCS verbal subscore is 5. GCS motor subscore is 6.     Cranial Nerves: No dysarthria or facial asymmetry.     Sensory: Sensation is intact.     Motor: Motor function is intact.     Coordination: Coordination is intact.     Comments: EOM testing with left eye lateral nerve palsy Peripheral fields intact Vision intact   Psychiatric:        Mood and Affect: Mood normal.        Behavior: Behavior normal.    ED Results / Procedures / Treatments   Labs (all labs ordered are listed, but only abnormal results are displayed) Labs Reviewed  COMPREHENSIVE METABOLIC PANEL - Abnormal; Notable for the following components:      Result Value   Glucose, Bld 105 (*)    All other components within normal limits  VITAMIN B12 - Abnormal; Notable for the following components:   Vitamin B-12 127 (*)    All other components within normal limits  TROPONIN I (HIGH SENSITIVITY) - Abnormal; Notable for the following components:   Troponin I (High Sensitivity) 61 (*)    All other components within normal limits  TROPONIN I (HIGH SENSITIVITY) - Abnormal; Notable for the following components:   Troponin I (High Sensitivity) 62 (*)    All other components within normal limits  CSF CULTURE W GRAM  STAIN  CBC  PROTIME-INR  SEDIMENTATION RATE  C-REACTIVE PROTEIN  HSV 1/2 PCR, CSF  PROTEIN AND GLUCOSE, CSF  CSF CELL COUNT WITH DIFFERENTIAL  CSF CELL COUNT WITH DIFFERENTIAL  LYMPHOCYTE SUBSETS, FLOW CYTOMETRY (INPT)  ANGIOTENSIN CONVERTING ENZYME, CSF  ANGIOTENSIN CONVERTING ENZYME  EXTRACTABLE NUCLEAR ANTIGEN ANTIBODY  ANTI-JO 1 ANTIBODY, IGG  ANCA TITERS  OLIGOCLONAL BANDS, CSF + SERM  [facility] EXTRA CLOT TUBE  IGG CSF INDEX  LYME DISEASE DNA BY PCR(BORRELIA BURG)  CYTOLOGY - NON PAP  EKG EKG Interpretation  Date/Time:  Saturday October 24 2020 13:14:31 EDT Ventricular Rate:  62 PR Interval:  177 QRS Duration: 97 QT Interval:  402 QTC Calculation: 409 R Axis:   32 Text Interpretation: Sinus rhythm Abnormal R-wave progression, early transition Confirmed by Pattricia Boss [phone]) on 10/24/2020 1:41:40 PM  Radiology CT Head Wo Contrast  Result Date: 10/24/2020 CLINICAL DATA:  Headache. Right mandibular pain radiating behind the right eye. No reported injury. EXAM: CT HEAD WITHOUT CONTRAST CT MAXILLOFACIAL WITHOUT CONTRAST TECHNIQUE: Multidetector CT imaging of the head and maxillofacial structures were performed using the standard protocol without intravenous contrast. Multiplanar CT image reconstructions of the maxillofacial structures were also generated. COMPARISON:  Brain MRI dated 06/01/2013. FINDINGS: CT HEAD FINDINGS Brain: Minimally enlarged ventricles and subarachnoid spaces. No intracranial hemorrhage, mass lesion or CT evidence of acute infarction. Vascular: No hyperdense vessel or unexpected calcification. Skull: Normal. Negative for fracture or focal lesion. Other: None. CT MAXILLOFACIAL FINDINGS Osseous: No fracture or mandibular dislocation. No destructive process. No visible dental caries or periapical abscesses. Orbits: Negative. No traumatic or inflammatory finding. Sinuses: Bilateral maxillary sinus retention cysts. No evidence of sinusitis. Soft tissues:  Unremarkable. IMPRESSION: 1. No acute abnormality. 2. Minimal diffuse cerebral and cerebellar atrophy Electronically Signed   By: Claudie Revering M.D.   On: 10/24/2020 13:09   MR Brain W and Wo Contrast  Result Date: 10/24/2020 CLINICAL DATA:  Neuro deficit, acute, stroke suspected.  Diplopia. EXAM: MRI HEAD AND ORBITS WITHOUT AND WITH CONTRAST TECHNIQUE: Multiplanar, multiecho pulse sequences of the brain and surrounding structures were obtained without and with intravenous contrast. Multiplanar, multiecho pulse sequences of the orbits and surrounding structures were obtained including fat saturation techniques, before and after intravenous contrast administration. CONTRAST:  39m GADAVIST GADOBUTROL 1 MMOL/ML IV SOLN COMPARISON:  CT head/maxillofacial 10/24/2020. prior brain MRI examinations 06/01/2013 and earlier. FINDINGS: MRI HEAD FINDINGS Brain: Cerebral volume is normal. No cortical encephalomalacia is identified. No significant cerebral white matter disease. There is no acute infarct. No evidence of an intracranial mass. No chronic intracranial blood products. No extra-axial fluid collection. No midline shift. No abnormal intracranial enhancement. Vascular: Expected proximal arterial flow voids. Skull and upper cervical spine: No focal marrow lesion. MRI ORBITS FINDINGS Orbits: The globes are normal in size and contour. Unremarkable appearance of the extraocular muscles and optic nerve sheath complexes bilaterally. No intraorbital mass or abnormal intraorbital enhancement is identified. Visualized sinuses: Small bilateral maxillary sinus mucous retention cysts. Soft tissues: Unremarkable IMPRESSION: MRI brain: Unremarkable MRI appearance of the brain. No evidence of acute intracranial abnormality. MRI orbits: Unremarkable MRI of the orbits. Electronically Signed   By: KKellie SimmeringDO   On: 10/24/2020 19:06   CT Maxillofacial Wo Contrast  Result Date: 10/24/2020 CLINICAL DATA:  Headache. Right mandibular  pain radiating behind the right eye. No reported injury. EXAM: CT HEAD WITHOUT CONTRAST CT MAXILLOFACIAL WITHOUT CONTRAST TECHNIQUE: Multidetector CT imaging of the head and maxillofacial structures were performed using the standard protocol without intravenous contrast. Multiplanar CT image reconstructions of the maxillofacial structures were also generated. COMPARISON:  Brain MRI dated 06/01/2013. FINDINGS: CT HEAD FINDINGS Brain: Minimally enlarged ventricles and subarachnoid spaces. No intracranial hemorrhage, mass lesion or CT evidence of acute infarction. Vascular: No hyperdense vessel or unexpected calcification. Skull: Normal. Negative for fracture or focal lesion. Other: None. CT MAXILLOFACIAL FINDINGS Osseous: No fracture or mandibular dislocation. No destructive process. No visible dental caries or periapical abscesses. Orbits: Negative. No traumatic or inflammatory finding. Sinuses:  Bilateral maxillary sinus retention cysts. No evidence of sinusitis. Soft tissues: Unremarkable. IMPRESSION: 1. No acute abnormality. 2. Minimal diffuse cerebral and cerebellar atrophy Electronically Signed   By: Claudie Revering M.D.   On: 10/24/2020 13:09   MR ORBITS W WO CONTRAST  Result Date: 10/24/2020 CLINICAL DATA:  Neuro deficit, acute, stroke suspected.  Diplopia. EXAM: MRI HEAD AND ORBITS WITHOUT AND WITH CONTRAST TECHNIQUE: Multiplanar, multiecho pulse sequences of the brain and surrounding structures were obtained without and with intravenous contrast. Multiplanar, multiecho pulse sequences of the orbits and surrounding structures were obtained including fat saturation techniques, before and after intravenous contrast administration. CONTRAST:  37m GADAVIST GADOBUTROL 1 MMOL/ML IV SOLN COMPARISON:  CT head/maxillofacial 10/24/2020. prior brain MRI examinations 06/01/2013 and earlier. FINDINGS: MRI HEAD FINDINGS Brain: Cerebral volume is normal. No cortical encephalomalacia is identified. No significant cerebral  white matter disease. There is no acute infarct. No evidence of an intracranial mass. No chronic intracranial blood products. No extra-axial fluid collection. No midline shift. No abnormal intracranial enhancement. Vascular: Expected proximal arterial flow voids. Skull and upper cervical spine: No focal marrow lesion. MRI ORBITS FINDINGS Orbits: The globes are normal in size and contour. Unremarkable appearance of the extraocular muscles and optic nerve sheath complexes bilaterally. No intraorbital mass or abnormal intraorbital enhancement is identified. Visualized sinuses: Small bilateral maxillary sinus mucous retention cysts. Soft tissues: Unremarkable IMPRESSION: MRI brain: Unremarkable MRI appearance of the brain. No evidence of acute intracranial abnormality. MRI orbits: Unremarkable MRI of the orbits. Electronically Signed   By: KKellie SimmeringDO   On: 10/24/2020 19:06    Procedures .Lumbar Puncture  Date/Time: 10/24/2020 11:17 PM Performed by: TWyvonnia Dusky MD Authorized by: TWyvonnia Dusky MD   Consent:    Consent obtained:  Verbal   Consent given by:  Patient   Risks, benefits, and alternatives were discussed: yes     Risks discussed:  Infection, nerve damage, headache, bleeding, pain and repeat procedure   Alternatives discussed:  Delayed treatment and alternative treatment Universal protocol:    Procedure explained and questions answered to patient or proxy's satisfaction: yes     Test results available: yes     Imaging studies available: yes     Immediately prior to procedure a time out was called: yes     Site/side marked: yes     Patient identity confirmed:  Arm band Pre-procedure details:    Procedure purpose:  Diagnostic   Preparation: Patient was prepped and draped in usual sterile fashion   Anesthesia:    Anesthesia method:  Local infiltration   Local anesthetic:  Lidocaine 1% w/o epi Procedure details:    Lumbar space:  L4-L5 interspace   Patient position:   Sitting   Needle gauge:  22   Number of attempts:  2 Post-procedure details:    Puncture site:  Adhesive bandage applied and direct pressure applied   Procedure completion:  Tolerated well, no immediate complications Comments:     Unsuccessful LP    Medications Ordered in ED Medications  prochlorperazine (COMPAZINE) injection 10 mg (10 mg Intravenous Not Given 10/24/20 1439)  tetracaine (PONTOCAINE) 0.5 % ophthalmic solution 2 drop (2 drops Right Eye Given by Other 10/24/20 1300)  oxyCODONE-acetaminophen (PERCOCET/ROXICET) 5-325 MG per tablet 2 tablet (2 tablets Oral Given 10/24/20 1436)  gadobutrol (GADAVIST) 1 MMOL/ML injection 10 mL (10 mLs Intravenous Contrast Given 10/24/20 1727)  ondansetron (ZOFRAN) injection 4 mg (4 mg Intravenous Given 10/24/20 1959)  methylPREDNISolone sodium succinate (SOLU-MEDROL)  1,000 mg in sodium chloride 0.9 % 50 mL IVPB (0 mg Intravenous Stopped 10/24/20 2245)    ED Course  I have reviewed the triage vital signs and the nursing notes.  Pertinent labs & imaging results that were available during my care of the patient were reviewed by me and considered in my medical decision making (see chart for details).  60 yo male here with left cranial nerve 6 palsy and RIGHT sided headache and pain behind right eye x 7 days.  Imaging from [facility] reviewed -no focal findings on [facility] or CT maxillofacial scan. CMP and CBC unremarkable.  WBC is normal.    MRI brain and orbits obtained here and unremarkable.  He has no isolated temporal tenderness to suggest GCA.  ESR and CRP are normal.  Lower suspicion for GCA at this time.  This does not appear clinically to be an acute intra-ocular emergency including glaucoma, retinal detachment, iritis, or infection.  Neurology consulted at [facility] for in-person evaluation with Dr Rory Percy. In brief, he has recommended IV steroid treatment for possible autoimmune condition.   I agree with Dr Rory Percy appears to be less likely an  infectious meningitis, specifically bacterial or fungal meningitis -  with no fever, no photophobia, no leukocytosis after 7 days of symptoms.  I discussed need for further CT vascular imaging with Dr Rory Percy who felt this was not necessary at this time, and less likely to be a vascular cause of symptoms.  Clinical Course as of 10/24/20 2328  Sat Oct 24, 2020  1745 Patient taken for MRI.  Neurology consult placed. [MT]  1914   IMPRESSION: MRI brain: Unremarkable MRI appearance of the brain. No evidence of acute intracranial abnormality.   MRI orbits:  Unremarkable MRI of the orbits. [MT]  2002 Dr Rory Percy at bedside. [MT]  2155 Attempted lumbar puncture unsuccessfully.  2 attempts were made.  We were not able to extract spinal fluid.  Based on my discussion with the neurologist consultant, we will proceed with the high-dose IV steroid infusion.  Dr. Rory Percy felt that with this treatment, patient would be reasonably safe for discharge home and follow-up with a neurologist as an outpatient, Dr Felecia Shelling.  I agree with this assessment.  A referral was placed to [facility] Neurology in our system.  These instructions were reviewed with the patient and his wife at bedside who agreed, and expressed a strong desire to go home if possible tonight. [MT]  2204 Patient's initial troponin is 61.  We do not have priors.  We will need to repeat this level.  His EKG was a normal sinus rhythm on arrival, and repeat EKG at this time shows no changes.  I do not see evidence of ischemia on his EKG.  He has no active chest pain, chest pressure or SOB to suggest ACS at this time.  It is unclear if is a chronically elevated troponin, but we will need to repeat on this.  Overall his HEART score is 3 (for risk factor, age, troponin) if his repeat troponin is flat, and given this presentation is quite atypical for ACS or dissection, I felt it would be reasonable for cardiology f/u as outpatient.  I discussed this with the patient and his  wife and placed a referral to cardiology.  They are in agreement with this plan. [MT]    Clinical Course User Index [MT] Campbell Agramonte, Carola Rhine, MD    Final Clinical Impression(s) / ED Diagnoses Final diagnoses:  Diplopia  Eye pain, right  Elevated troponin    Rx / DC Orders ED Discharge Orders          Ordered    Ambulatory referral to Neurology       Comments: An appointment is requested in approximately: as soon as possible Recommended referral by Dr Rory Percy (neurology) for outpatient lumbar puncture and office evaluation with Dr Felecia Shelling if possible   10/24/20 2215    Ambulatory referral to Cardiology        10/24/20 2313             Wyvonnia Dusky, MD 10/24/20 2328

## 2020-10-24 NOTE — Plan of Care (Addendum)
Curbside for diagnosis/management recommendations from Dr. Pattricia Boss regarding this patient, who is presenting with a right cranial nerve VI palsy per Dr. Maretta Bees examination, as well as a retro-orbital headache with radiation into the mandible for the past 1 week.  He has been previously seen for similar problems, including an episode of double vision diagnosed as a 4th nerve palsy in approximately 2005 (no details available), binocular double vision with a left retro-orbital headache and left cranial nerve VI palsy in 05/2013, with work-up notable for positive ANA, positive SSB, positive RNP but no clear evidence of an autoimmune or rheumatological disorder on rheumatology evaluation.  Lumbar puncture was considered but apparently not performed.  MRI brain with and without contrast, thyroid evaluation myasthenia labs were normal.  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  Patient reports that his headache is much more severe today than previously to Dr. Jeanell Sparrow, and he has generally been feeling worse over the past week with increased difficulty resting though the headache intensity has not necessarily increased.  Differential is broad and preliminary work-up should include -CT angio head and neck -CT venogram -MRI brain with and without contrast -MRI orbits with and without contrast  [facility] does not have MRI capabilities, and I recommend the patient be transferred to [facility] for MRI brain and orbits with and without contrast.  Will likely need a full neurological consult after scan, possibly lumbar puncture as well though I will defer to the neurologist on-call  at [facility].   Lesleigh Noe MD-PhD [facility] [phone]  Telecoverage for emergent consults (code strokes, status) and brief curbside recommendations at [facility], [facility] [place] is from 8 AM to to 8 PM by [facility] who is listed as on service for [facility] on Amion schedule.  For full routine consults at [facility] neuro hospitalist should be contacted

## 2020-10-24 NOTE — Consult Note (Signed)
Neurology Consultation  Reason for Consult: Headache, double vision, dizziness Referring Physician: Dr. 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 He describes that he started getting a headache sometime last Sunday mainly involving the right side of his face over his cheek and on the temple.   Headache is not constant although the discomfort is constant but there are no aggravating or relieving factors to the extreme discomfort that he has on the right side of his face including pain.  He also describes diplopia while looking to the left and blurred vision which he feels is probably due to excessive tear production because of his headache. He denies any change in the headache or discomfort with head position but reports a painful sensation around the back of his neck on the right side. Denies any droopy eyelids.  Denies any facial drooping or slurred speech.  Denies any gross weakness but describes dizziness which he describes as feeling of instability when he gets up from bed or sitting position where he feels that he will be imbalanced. In the past, he had been seen by neurology in 2015, autoimmune work-up for isolated cranial neuropathies was pursued, had abnormal ANA without a definitive diagnosis made, not on any disease modifying therapy. Denies any preceding fevers but reports a sinus infection for which she is on antibiotics.  ROS: Full ROS was performed and is negative except as noted in the HPI.  Past Medical History:  Diagnosis  Date   Cancer ([place])    Hypertension    Seizures ([place])    Family History  Problem Relation Age of Onset   Stroke Paternal Grandfather    Heart disease Maternal Grandfather    Hypertension Maternal Grandmother    High Cholesterol Father    Diabetes Father    Migraines Mother      Social History:   reports that he has quit smoking. He does not have any smokeless tobacco history on file. He reports that he does not drink alcohol and does not use drugs.  Medications  Current Facility-Administered Medications:    prochlorperazine (COMPAZINE) injection 10 mg, 10 mg, Intravenous, NOW, Pattricia Boss, MD  Current Outpatient Medications:    amoxicillin-clavulanate (AUGMENTIN) 875-125 MG tablet, Take 1 tablet by mouth every 12 (twelve) hours., Disp: , Rfl:    atorvastatin (LIPITOR) 10 MG tablet, Take 10 mg by mouth daily., Disp: , Rfl:    lisinopril (ZESTRIL) 10 MG tablet, Take 10 mg by mouth daily., Disp: , Rfl:    OVER THE COUNTER MEDICATION, Place 1 drop into both eyes in the morning and at bedtime. Medication: Eye Allergy Relief Drops. Pheniramine Maleate 0.315% and Naphazoline Hydrochloride 0.02675% Opth Soln, Disp: , Rfl:    Exam: Current vital signs: BP (!) 151/96   Pulse 71   Temp 97.8 F (36.6 C) (Oral)   Resp 19   Ht 6' (1.829 m)   Wt 108.4 kg   SpO2 98%   BMI 32.41 kg/m  Vital signs in last 24 hours: Temp:  [97.8 F (36.6 C)-98.1 F (36.7 C)] 97.8  F (36.6 C) (07/30 1844) Pulse Rate:  [62-73] 71 (07/30 2000) Resp:  [16-22] 19 (07/30 2000) BP: (126-163)/(89-109) 151/96 (07/30 2000) SpO2:  [98 %-100 %] 98 % (07/30 2000) Weight:  [108.4 kg] 108.4 kg (07/30 1211) General: Awake alert HEENT: Normocephalic atraumatic, neck is supple but there is tenderness on palpation on the right side Cardiovascular: Regular rate rhythm Respiratory: Breathing well saturating normally on room air Extremities warm well perfused Neurological examination awake alert oriented  x3 Speech is nondysarthric No evidence of aphasia Cranial nerve examination reveals equal pupils that are round and reactive to light, visual fields are full, extraocular movement examination reveals a mild inability to abduct the left eye and diplopia while looking to the left indicating 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  No facial asymmetry.  Facial sensation intact. Motor examination with no weakness in any of the 4 extremities. Sensation intact light touch temperature and vibration in all 4 extremities. No dysmetria on cerebellar exam Gait testing deferred DTRs: 2+ in biceps and brachioradialis bilaterally. He has brisk 3+ reflexes at the knees and ankles with downgoing toes and no clonus.  No Hoffmann sign.  Labs I have reviewed labs in epic and the results pertinent to this consultation are:  CBC    Component Value Date/Time   WBC 7.9 10/24/2020 1448   RBC 5.32 10/24/2020 1448   HGB 15.8 10/24/2020 1448   HCT 47.0 10/24/2020 1448   PLT 333 10/24/2020 1448   MCV 88.3 10/24/2020 1448   MCH 29.7 10/24/2020 1448   MCHC 33.6 10/24/2020 1448   RDW 12.7 10/24/2020 1448   LYMPHSABS 1.9 09/20/2008 1142   MONOABS 0.8 09/20/2008 1142   EOSABS 0.3 09/20/2008 1142   BASOSABS 0.1 09/20/2008 1142    CMP     Component Value Date/Time   NA 137 10/24/2020 1448   K 3.7 10/24/2020 1448   CL 100 10/24/2020 1448   CO2 27 10/24/2020 1448   GLUCOSE 105 (H) 10/24/2020 1448   BUN 8 10/24/2020 1448   CREATININE 1.03 10/24/2020 1448   CALCIUM 10.3 10/24/2020 1448   PROT 7.9 10/24/2020 1448   ALBUMIN 4.6 10/24/2020 1448   AST 17 10/24/2020 1448   ALT 21 10/24/2020 1448   ALKPHOS 112 10/24/2020 1448   BILITOT 0.7 10/24/2020 1448   GFRNONAA >60 10/24/2020 1448   GFRAA  10/21/2008 1000    >60        The eGFR has been calculated using the MDRD equation. This calculation has not been validated in all clinical situations. eGFR's persistently <60 mL/min signify possible Chronic  Kidney Disease.  Imaging I have reviewed the images obtained: Brain with and without contrast and MRI orbits with and without contrast-unremarkable, no evidence of acute intracranial abnormality.  No abnormal enhancement.  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 Given the history of prior involvement of multiple other cranial nerves-sixth and fourth, and abnormal autoimmune work-up without a clear diagnosis, there is concern for  autoimmune inflammatory process.  Patient is nontoxic, labs reassuring-less likely infectious process. Other differentials to include would be giant cell arteritis, but that usually does not tend to cause isolated cranial  nerve palsy.  Isolated cranial neuropathies can occur in the setting of Lyme disease, neurosarcoid also-we will check for those.  Recommendations: -LP-send out cell count, flow cytometry, cytology, HSV PCR, IgG index, oligoclonal bands, angiotensin-converting enzyme, CSF culture. -Serum angiotensin-converting enzyme -Lupus panel -ANCA -ESR -Sed rate -Check B12 levels -lyme serology  LP bedside failed. The patient does not want to stay in for longer than the test needs to be done.  Him being nontoxic, I would recommend 1 dose of Solu-Medrol 1 g IV and close follow-up with Dr. Felecia Shelling at [facility] neurology.  Even with the spinal tap failed, chances of this being an infectious process given a clean MRI, reassuring labs and vitals as well as distal neurological exam, I would still do the steroid 1 dose.  I have sent a clinical message to Dr. Felecia Shelling and patient will be given details of the clinic to call in case he does  not get call back.  Discussed the plan with Dr. Langston Masker in the ER -- Amie Portland, MD Neurologist [facility] Pager: [phone]

## 2020-10-24 NOTE — ED Notes (Signed)
Patient to transport private vehicle to [place] er for [employer]. Attempted report to [place] charge nurse.

## 2020-10-24 NOTE — ED Triage Notes (Signed)
Last Sunday began having rt sided jaw type pain, radiated behind rt eye. Has seen his dentist, has had episodes of nausea and poor appetite. Has been taking Allegra D. Saw [facility]. Major complaint is double vision and major HA

## 2020-10-24 NOTE — Discharge Instructions (Addendum)
You were given IV steroids today to treat for possible autoimmune condition or inflammation causing your headache and blurred vision.  Our neurologist talked to you about this condition.  Although we do not see signs of stroke or bleeding in the brain, there are still several conditions which could be causing this.  It is important you follow-up with a neurologist as soon as possible.  A referral was placed to the neurology group.  If you do not hear from them by the afternoon on Monday, please call the number above and ask for an appointment with Dr. Felecia Shelling.  They can arrange further steps and follow-up.  *  We also talked about your troponin blood test (heart enzyme level).  This was mildly abnormal.  This is not a specific finding, but may indicate some stress with your heart.  I placed a referral to the cardiology office.  They should contact you in 1-2 business days to arrange a follow up appointment.  You may need additional testing in the office.  *  In the meantime, if you begin experiencing new symptoms, such as severe headache, loss of vision, slurred speech, numbness or weakness, chest pain or pressure, difficulty breathing, please come back to the ER immediately.

## 2020-10-24 NOTE — ED Notes (Signed)
ED Provider at bedside. 

## 2020-10-24 NOTE — ED Notes (Signed)
Patient reports right side posterior headache and mild nausea. Patient reports headache started last Sunday, has been taking antibiotic that were prescribed by UC and Allegra that was prescribed by ophthalmology. No relief from pain. Pain started in right forehead, behind the eye and in the right jaw and has now moved to the posterior head and neck.

## 2020-10-24 NOTE — ED Provider Notes (Addendum)
[facility] EMERGENCY DEPT Provider Note   CSN: [id number] Arrival date & time: 10/24/20  [id number]     History Chief Complaint  Patient presents with   Migraine    [name] [name] is a 60 y.o. male.  HPI 60 year old man history of hypertension, seizures, cancer, 6th nerve palsy in the past presents today complaining of headache that began last Sunday in his forehead then radiated to behind his right eye and a right sided jaw pain that he identifies as a toothache.  He has been seen this week by his eye doctor and his primary care doctor.  He has begun having some double vision again.  He states that when he looked at his phone today he could see to.  He continues to have pain with pressure behind his right eye.  He has not had any fever, chills, injury, he is not on blood thinners.     Past Medical History:  Diagnosis Date   Cancer ([place])    Hypertension    Seizures ([place])     There are no problems to display for this patient.   Past Surgical History:  Procedure Laterality Date   ABDOMINAL SURGERY     BRAIN SURGERY         Family History  Problem Relation Age of Onset   Stroke Paternal Grandfather    Heart disease Maternal Grandfather    Hypertension Maternal Grandmother    High Cholesterol Father    Diabetes Father    Migraines Mother     Social History   Tobacco Use   Smoking status: Former  Substance Use Topics   Alcohol use: No   Drug use: No    Home Medications Prior to Admission medications   Medication Sig Start Date End Date Taking? Authorizing Provider  atorvastatin (LIPITOR) 10 MG tablet Take 10 mg by mouth daily. 03/16/20   [provider]  etodolac (LODINE) 400 MG tablet  06/03/13   [provider]  HYDROcodone-acetaminophen (NORCO/VICODIN) 5-325 MG per tablet  06/04/13   [provider]  lisinopril (ZESTRIL) 10 MG tablet Take 10 mg by mouth daily. 04/20/20   [provider]  terbinafine (LAMISIL)  250 MG tablet Take 1 tablet (250 mg total) by mouth daily. Patient not taking: Reported on 10/02/2020 04/27/20   Wallene Huh, DPM    Allergies    Erythromycin  Review of Systems   Review of Systems  All other systems reviewed and are negative.  Physical Exam Updated Vital Signs BP (!) 154/97   Pulse 62   Temp 98.1 F (36.7 C) (Oral)   Resp 20   Ht 1.829 m (6')   Wt 108.4 kg   SpO2 100%   BMI 32.41 kg/m  Well-developed well-nourished 45-year-old male no acute distress head is normocephalic atraumatic Eyes pupils are equal round react to light there is slowed abduction of the right compared to the left  Intraocular pressure of the right eyes measured and 22 Nares patent Ears external ears and TMs are normal Neck is supple no JVD carotid pulses are 2+ thyroid is normal and nontender Chest wall is normal Lungs are clear to auscultation Heart is regular rate and rhythm Abdomen soft and nontender Extremities without any signs of trauma Neurologically patient is alert and oriented x4 Strength is equal bilaterally with no palmar drift or leg drift noted Brachioradialis 1+ Patellar reflexes 2+ Normal gait Patient is normal mentation with normal psychiatric exam   ED Results / Procedures /  Treatments   Labs (all labs ordered are listed, but only abnormal results are displayed) Labs Reviewed  CBC  COMPREHENSIVE METABOLIC PANEL  PROTIME-INR    EKG EKG Interpretation  Date/Time:  Saturday October 24 2020 13:14:31 EDT Ventricular Rate:  62 PR Interval:  177 QRS Duration: 97 QT Interval:  402 QTC Calculation: 409 R Axis:   32 Text Interpretation: Sinus rhythm Abnormal R-wave progression, early transition Confirmed by Pattricia Boss [phone]) on 10/24/2020 1:41:40 PM  Radiology CT Head Wo Contrast  Result Date: 10/24/2020 CLINICAL DATA:  Headache. Right mandibular pain radiating behind the right eye. No reported injury. EXAM: CT HEAD WITHOUT CONTRAST CT MAXILLOFACIAL  WITHOUT CONTRAST TECHNIQUE: Multidetector CT imaging of the head and maxillofacial structures were performed using the standard protocol without intravenous contrast. Multiplanar CT image reconstructions of the maxillofacial structures were also generated. COMPARISON:  Brain MRI dated 06/01/2013. FINDINGS: CT HEAD FINDINGS Brain: Minimally enlarged ventricles and subarachnoid spaces. No intracranial hemorrhage, mass lesion or CT evidence of acute infarction. Vascular: No hyperdense vessel or unexpected calcification. Skull: Normal. Negative for fracture or focal lesion. Other: None. CT MAXILLOFACIAL FINDINGS Osseous: No fracture or mandibular dislocation. No destructive process. No visible dental caries or periapical abscesses. Orbits: Negative. No traumatic or inflammatory finding. Sinuses: Bilateral maxillary sinus retention cysts. No evidence of sinusitis. Soft tissues: Unremarkable. IMPRESSION: 1. No acute abnormality. 2. Minimal diffuse cerebral and cerebellar atrophy Electronically Signed   By: Claudie Revering M.D.   On: 10/24/2020 13:09   CT Maxillofacial Wo Contrast  Result Date: 10/24/2020 CLINICAL DATA:  Headache. Right mandibular pain radiating behind the right eye. No reported injury. EXAM: CT HEAD WITHOUT CONTRAST CT MAXILLOFACIAL WITHOUT CONTRAST TECHNIQUE: Multidetector CT imaging of the head and maxillofacial structures were performed using the standard protocol without intravenous contrast. Multiplanar CT image reconstructions of the maxillofacial structures were also generated. COMPARISON:  Brain MRI dated 06/01/2013. FINDINGS: CT HEAD FINDINGS Brain: Minimally enlarged ventricles and subarachnoid spaces. No intracranial hemorrhage, mass lesion or CT evidence of acute infarction. Vascular: No hyperdense vessel or unexpected calcification. Skull: Normal. Negative for fracture or focal lesion. Other: None. CT MAXILLOFACIAL FINDINGS Osseous: No fracture or mandibular dislocation. No destructive  process. No visible dental caries or periapical abscesses. Orbits: Negative. No traumatic or inflammatory finding. Sinuses: Bilateral maxillary sinus retention cysts. No evidence of sinusitis. Soft tissues: Unremarkable. IMPRESSION: 1. No acute abnormality. 2. Minimal diffuse cerebral and cerebellar atrophy Electronically Signed   By: Claudie Revering M.D.   On: 10/24/2020 13:09    Procedures Procedures   Medications Ordered in ED Medications  tetracaine (PONTOCAINE) 0.5 % ophthalmic solution 2 drop (has no administration in time range)  prochlorperazine (COMPAZINE) injection 10 mg (has no administration in time range)    ED Course  I have reviewed the triage vital signs and the nursing notes.  Pertinent labs & imaging results that were available during my care of the patient were reviewed by me and considered in my medical decision making (see chart for details).    MDM Rules/Calculators/A&P                           60 year old man with right-sided headache and diplopia with 6th nerve palsy Head CT without any evidence of acute abnormality Discussed with Dr. Curly Shores.  She advises that patient needs MRI of brain and orbits with and without contrast. Patient has been difficult IV access.  He wishes to go by private vehicle.  Will give p.o. pain medicine.  Patient advised regarding need for MRI.  He is advised to go directly to [facility]. Discussed with Dr. Langston Masker and he is aware that labs will need to be followed up on..  Patient is going directly to [facility] by private vehicle.  Orders for MRI placed. Discussed with Dr. Curly Shores and requests that cta and ct v be added  Final Clinical Impression(s) / ED Diagnoses Final diagnoses:  Diplopia  Eye pain, right    Rx / DC Orders ED Discharge Orders     None        Pattricia Boss, MD 10/24/20 1431    Pattricia Boss, MD 10/24/20 1441

## 2020-10-24 NOTE — ED Provider Notes (Signed)
Care assumed from Dr. Langston Masker at shift change pending second troponin and IV solumedrol to finish.  See his note for full H&P.   Briefly this is a 60 year old male presenting with headache for the last x1 week.  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  Patient was seen at [facility] earlier today and had a head CT and maxillofacial that were unremarkable.  Neurology was consulted and it was recommended that he be transferred for additional cranial imaging.  Patient complained of left jaw pain so troponin was added to work-up.  First troponin is elevated at 61, no prior to compare.  CBC and CMP are overall unremarkable.  EKG sinus rhythm patient denies any chest pain or typical ACS symptoms.  MRI brain and orbits were obtained and are without acute findings.  Neurology was consulted again and it was recommended the patient have LP.  ED attending attempted LP without success.  Neurology agreed that this could be performed outpatient and referral was placed.  ED attending Dr, Langston Masker also sent ambulatory referral to cardiology given elevated troponin.   Physical Exam  BP (!) 143/91   Pulse 72   Temp 97.8 F (36.6 C) (Oral)   Resp 18   Ht 6' (1.829 m)   Wt 108.4 kg   SpO2 97%   BMI 32.41 kg/m   Physical Exam PE: Constitutional: well-developed, well-nourished, no apparent distress HENT: normocephalic, atraumatic. no cervical adenopathy Cardiovascular: normal rate and rhythm, distal pulses intact Pulmonary/Chest: effort normal; breath sounds clear and equal bilaterally; no wheezes or rales Abdominal: soft and nontender Musculoskeletal: full ROM, no edema Neurological: alert with goal directed thinking Skin: warm and dry, no rash, no diaphoresis Psychiatric: normal mood and affect, normal behavior   ED Course/Procedures   Clinical Course as of 10/25/20 0006  Sat Oct 24, 2020  1745 Patient taken for MRI.  Neurology consult placed. [MT]  1914   IMPRESSION: MRI  brain: Unremarkable MRI appearance of the brain. No evidence of acute intracranial abnormality.   MRI orbits:  Unremarkable MRI of the orbits. [MT]  2002 Dr Rory Percy at bedside. [MT]  2155 Attempted lumbar puncture unsuccessfully.  2 attempts were made.  We were not able to extract spinal fluid.  Based on my discussion with the neurologist consultant, we will proceed with the high-dose IV steroid infusion.  Dr. Rory Percy felt that with this treatment, patient would be reasonably safe for discharge home and follow-up with a neurologist as an outpatient, Dr Felecia Shelling.  I agree with this assessment.  A referral was placed to [facility] in our system.  These instructions were reviewed with the patient and his wife at bedside who agreed, and expressed a strong desire to go home if possible tonight. [MT]  2204 Patient's initial troponin is 61.  We do not have priors.  We will need to repeat this level.  His EKG was a normal sinus rhythm on arrival, and repeat EKG at this time shows no changes.  I do not see evidence of ischemia on his EKG.  He has no active chest pain, chest pressure or SOB to suggest ACS at this time.  It is unclear if is a chronically elevated troponin, but we will need to repeat on this.  Overall his HEART score is 3 (for risk factor, age, troponin) if his repeat troponin is flat, and given this presentation is quite atypical for ACS or dissection, I felt it would be reasonable for cardiology f/u as  outpatient.  I discussed this with the patient and his wife and placed a referral to cardiology.  They are in agreement with this plan. [MT]    Clinical Course User Index [MT] Wyvonnia Dusky, MD   Results for orders placed or performed during the hospital encounter of 10/24/20 (from the past 24 hour(s))  CBC     Status: None   Collection Time: 10/24/20  2:48 PM  Result Value Ref Range   WBC 7.9 4.0 - 10.5 K/uL   RBC 5.32 4.22 - 5.81 MIL/uL   Hemoglobin 15.8 13.0 - 17.0 g/dL   HCT 47.0  39.0 - 52.0 %   MCV 88.3 80.0 - 100.0 fL   MCH 29.7 26.0 - 34.0 pg   MCHC 33.6 30.0 - 36.0 g/dL   RDW 12.7 11.5 - 15.5 %   Platelets 333 150 - 400 K/uL   nRBC 0.0 0.0 - 0.2 %  Comprehensive metabolic panel     Status: Abnormal   Collection Time: 10/24/20  2:48 PM  Result Value Ref Range   Sodium 137 135 - 145 mmol/L   Potassium 3.7 3.5 - 5.1 mmol/L   Chloride 100 98 - 111 mmol/L   CO2 27 22 - 32 mmol/L   Glucose, Bld 105 (H) 70 - 99 mg/dL   BUN 8 6 - 20 mg/dL   Creatinine, Ser 1.03 0.61 - 1.24 mg/dL   Calcium 10.3 8.9 - 10.3 mg/dL   Total Protein 7.9 6.5 - 8.1 g/dL   Albumin 4.6 3.5 - 5.0 g/dL   AST 17 15 - 41 U/L   ALT 21 0 - 44 U/L   Alkaline Phosphatase 112 38 - 126 U/L   Total Bilirubin 0.7 0.3 - 1.2 mg/dL   GFR, Estimated >60 >60 mL/min   Anion gap 10 5 - 15  Protime-INR     Status: None   Collection Time: 10/24/20  2:48 PM  Result Value Ref Range   Prothrombin Time 12.1 11.4 - 15.2 seconds   INR 0.9 0.8 - 1.2  Sedimentation rate     Status: None   Collection Time: 10/24/20  8:29 PM  Result Value Ref Range   Sed Rate 13 0 - 16 mm/hr  C-reactive protein     Status: None   Collection Time: 10/24/20  8:29 PM  Result Value Ref Range   CRP <0.5 <1.0 mg/dL  Troponin I (High Sensitivity)     Status: Abnormal   Collection Time: 10/24/20  8:29 PM  Result Value Ref Range   Troponin I (High Sensitivity) 61 (H) <18 ng/L  Vitamin B12     Status: Abnormal   Collection Time: 10/24/20  8:29 PM  Result Value Ref Range   Vitamin B-12 127 (L) 180 - 914 pg/mL   MRI HEAD AND ORBITS WITHOUT AND WITH CONTRAST     TECHNIQUE:  Multiplanar, multiecho pulse sequences of the brain and surrounding  structures were obtained without and with intravenous contrast.  Multiplanar, multiecho pulse sequences of the orbits and surrounding  structures were obtained including fat saturation techniques, before  and after intravenous contrast administration.     CONTRAST:  41m GADAVIST  GADOBUTROL 1 MMOL/ML IV SOLN     COMPARISON:  CT head/maxillofacial 10/24/2020. prior brain MRI  examinations 06/01/2013 and earlier.     FINDINGS:  MRI HEAD FINDINGS     Brain:     Cerebral volume is normal.     No cortical encephalomalacia is identified. No significant  cerebral  white matter disease.     There is no acute infarct.     No evidence of an intracranial mass.     No chronic intracranial blood products.     No extra-axial fluid collection.     No midline shift.     No abnormal intracranial enhancement.     Vascular: Expected proximal arterial flow voids.     Skull and upper cervical spine: No focal marrow lesion.     MRI ORBITS FINDINGS     Orbits: The globes are normal in size and contour. Unremarkable  appearance of the extraocular muscles and optic nerve sheath  complexes bilaterally. No intraorbital mass or abnormal intraorbital  enhancement is identified.     Visualized sinuses: Small bilateral maxillary sinus mucous retention  cysts.     Soft tissues: Unremarkable     IMPRESSION:  MRI brain:     Unremarkable MRI appearance of the brain. No evidence of acute  intracranial abnormality.     MRI orbits:     Unremarkable MRI of the orbits.        Electronically Signed    By: Kellie Simmering DO    On: 10/24/2020 19:06    EKG Interpretation  Date/Time:  Saturday October 24 2020 13:14:31 EDT Ventricular Rate:  62 PR Interval:  177 QRS Duration: 97 QT Interval:  402 QTC Calculation: 409 R Axis:   32 Text Interpretation: Sinus rhythm Abnormal R-wave progression, early transition Confirmed by Pattricia Boss [phone]) on 10/24/2020 1:41:40 PM         MDM  Patient received in sign out. Please see previous provider note to include MDM up to this point.   CRP and sed rate are unremarkable.  Troponin still in process.  Will sign out to night attending Dr. Betsey Holiday.  Patient received 1000 mg IV Solu-Medrol. Patient is resting comfortably at time of  sign out.   Portions of this note were generated with [employer]. Dictation errors may occur despite best attempts at proofreading.         Barrie Folk, PA-C 10/25/20 0014    Fredia Sorrow, MD 10/29/20 1246

## 2020-10-25 NOTE — ED Notes (Signed)
Provided pt w/ beverage.

## 2020-10-25 NOTE — ED Provider Notes (Signed)
Patient's received as a signout.  He had been worked up for headache.  Patient seen by neurology, had MRI and further investigations and outpatient follow-up are in place.  Reviewing his labs, however, it is noted that he did have an elevated troponin.  Reexamining the patient, he did not ever have any chest pain or shortness of breath.  Patient with predominant headache but some pain down into the left jaw, which prompted the troponin.  A second troponin was similar to the first.  Discussed with Dr. Einar Gip, on-call for cardiology.  With lack of EKG changes, no active chest pain and flat troponins, he felt that the patient could follow-up as an outpatient.  Discharged with instructions to call Dr. Joyce Gross office for follow-up as well as his neurology outpatient follow-up instructions.   Orpah Greek, MD 10/25/20 0127

## 2020-10-26 ENCOUNTER — Other Ambulatory Visit: Payer: Self-pay

## 2020-10-26 ENCOUNTER — Ambulatory Visit: Payer: BC Managed Care – PPO | Admitting: Student

## 2020-10-26 ENCOUNTER — Encounter: Payer: Self-pay | Admitting: Student

## 2020-10-26 VITALS — BP 134/84 | HR 76 | Temp 98.0°F | Ht 72.0 in | Wt 242.0 lb

## 2020-10-26 DIAGNOSIS — R778 Other specified abnormalities of plasma proteins: Secondary | ICD-10-CM

## 2020-10-26 DIAGNOSIS — E7849 Other hyperlipidemia: Secondary | ICD-10-CM

## 2020-10-26 DIAGNOSIS — I10 Essential (primary) hypertension: Secondary | ICD-10-CM

## 2020-10-26 LAB — ANCA TITERS
Atypical P-ANCA titer: <1:20 {titer}
C-ANCA: <1:20 {titer}
P-ANCA: <1:20 {titer}

## 2020-10-26 LAB — EXTRACTABLE NUCLEAR ANTIGEN ANTIBODY
ENA SM Ab Ser-aCnc: <0.2 AI (ref 0.0–0.9)
Ribonucleic Protein: 1.6 AI — ABNORMAL HIGH (ref 0.0–0.9)
SSA (Ro) (ENA) Antibody, IgG: <0.2 AI (ref 0.0–0.9)
SSB (La) (ENA) Antibody, IgG: 1.7 AI — ABNORMAL HIGH (ref 0.0–0.9)
Scleroderma (Scl-70) (ENA) Antibody, IgG: <0.2 AI (ref 0.0–0.9)
ds DNA Ab: <1 IU/mL (ref 0–9)

## 2020-10-26 LAB — ANTI-JO 1 ANTIBODY, IGG: Anti JO-1: <0.2 AI (ref 0.0–0.9)

## 2020-10-26 NOTE — Progress Notes (Signed)
Primary Physician/Referring:  [name], MD  Patient ID: [name] [name], male    DOB: January 16, 1961, 60 y.o.   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  Denies history of diabetes, MI, CVA/TIA, PE/DVT, tobacco use, alcohol use, family history of premature CAD.  Patient also has history of multiple cranial nerve palsies.   Patient presented to the ED 10/24/2020 with diplopia and worsening right eye pain and headache.  During evaluation in the emergency room troponin was mildly elevated and flat at 61-->62.  Patient's work-up in the ED was otherwise relatively unyielding.  He is currently scheduled for further follow-up and evaluation by neurology 10/28/2020.  Patient denies chest pain, palpitations, syncope, near syncope, dyspnea.  Prior to onset of diplopia and headache patient walked 1-1.5 hours/day without issue.  He has noted since development of the symptoms he has also noticed increased fatigue, however he reports he has not been sleeping well.  Past Medical History:  Diagnosis Date   Cancer ([place])    Hypertension    Seizures ([place])    Past Surgical History:  Procedure Laterality Date   ABDOMINAL SURGERY     BRAIN SURGERY     Family History  Problem Relation Age of Onset   Migraines Mother    Stroke Father    High Cholesterol Father    Diabetes Father    Hypertension Maternal Grandmother    Heart disease Maternal Grandfather    Stroke Paternal Grandfather     Social History   Tobacco Use   Smoking status: Former    Types: Cigarettes   Smokeless tobacco: Never  Substance Use Topics   Alcohol use: No   Marital Status: Married   ROS  Review of Systems  Constitutional: Positive for malaise/fatigue.  Eyes:  Positive for double vision and pain.   Cardiovascular:  Negative for chest pain, claudication, leg swelling, near-syncope, orthopnea, palpitations, paroxysmal nocturnal dyspnea and syncope.  Respiratory:  Negative for shortness of breath.   Neurological:  Positive for headaches. Negative for dizziness.   Objective  Blood pressure 134/84, pulse 76, temperature 98 F (36.7 C), height 6' (1.829 m), weight 242 lb (109.8 kg), SpO2 99 %.  Vitals with BMI 10/28/2020 10/26/2020 10/25/2020  Height '6\' 0"'$  '6\' 0"'$  -  Weight 241 lbs 242 lbs -  BMI [id number] -  Systolic Q000111Q Q000111Q Q000111Q  Diastolic 90 84 94  Pulse 68 76 79      Physical Exam Vitals reviewed.  HENT:     Head: Normocephalic and atraumatic.  Cardiovascular:     Rate and Rhythm: Normal rate and regular rhythm.     Pulses: Intact distal pulses.     Heart sounds: S1 normal and S2 normal. No murmur heard.   No gallop.  Pulmonary:     Effort: Pulmonary effort is normal. No respiratory distress.     Breath sounds: No wheezing, rhonchi or rales.  Musculoskeletal:     Right lower leg: No edema.     Left lower leg: No edema.  Neurological:     General: No focal deficit present.     Mental Status: He is alert.     Comments: Disconjugate gaze, left eye lateral nerve palsy. Facial symmetry and strength intact. No dysarthria. Bilateral upper and lower  extremity strength in tact.    Laboratory examination:   Recent Labs    10/24/20 1448 10/24/20 1519  NA 137 136  K 3.7 3.5  CL 100 104  CO2 27 23  GLUCOSE 105* 114*  BUN 8 8  CREATININE 1.03 1.02  CALCIUM 10.3 9.0  GFRNONAA >60 >60   estimated creatinine clearance is 99.8 mL/min (by C-G formula based on SCr of 1.02 mg/dL).  CMP Latest Ref Rng & Units 10/24/2020 10/24/2020 10/21/2008  Glucose 70 - 99 mg/dL 114(H) 105(H) 102(H)  BUN 6 - 20 mg/dL '8 8 13  '$ Creatinine 0.61 - 1.24 mg/dL 1.02 1.03 0.95  Sodium 135 - 145 mmol/L 136 137 138  Potassium 3.5 - 5.1 mmol/L 3.5 3.7 3.4(L)  Chloride 98 - 111 mmol/L 104 100 107  CO2 22 - 32  mmol/L '23 27 22  '$ Calcium 8.9 - 10.3 mg/dL 9.0 10.3 9.3  Total Protein 6.5 - 8.1 g/dL 6.4(L) 7.9 6.6  Total Bilirubin 0.3 - 1.2 mg/dL 0.8 0.7 0.7  Alkaline Phos 38 - 126 U/L 105 112 100  AST 15 - 41 U/L '22 17 15  '$ ALT 0 - 44 U/L '23 21 17   '$ CBC Latest Ref Rng & Units 10/24/2020 10/24/2020 10/21/2008  WBC 4.0 - 10.5 K/uL 7.9 7.9 8.0  Hemoglobin 13.0 - 17.0 g/dL 14.9 15.8 14.2  Hematocrit 39.0 - 52.0 % 43.4 47.0 42.8  Platelets 150 - 400 K/uL 321 333 365   Lipid Panel No results for input(s): CHOL, TRIG, LDLCALC, VLDL, HDL, CHOLHDL, LDLDIRECT in the last 8760 hours.  HEMOGLOBIN A1C Lab Results  Component Value Date   HGBA1C 5.6 06/06/2013   TSH Recent Labs    10/28/20 1016  TSH 1.240    External labs:   None   Allergies   Allergies  Allergen Reactions   Erythromycin     Unknown    Medications Prior to Visit:   Outpatient Medications Prior to Visit  Medication Sig Dispense Refill   acetaminophen (TYLENOL) 500 MG tablet Take 500 mg by mouth every 6 (six) hours as needed.     amoxicillin-clavulanate (AUGMENTIN) 875-125 MG tablet Take 1 tablet by mouth every 12 (twelve) hours.     atorvastatin (LIPITOR) 10 MG tablet Take 10 mg by mouth daily.     ibuprofen (ADVIL) 200 MG tablet Take 200 mg by mouth every 6 (six) hours as needed. Takes Motrin brand     lisinopril (ZESTRIL) 10 MG tablet Take 10 mg by mouth daily.     OVER THE COUNTER MEDICATION Place 1 drop into both eyes in the morning and at bedtime. Medication: Eye Allergy Relief Drops. Pheniramine Maleate 0.315% and Naphazoline Hydrochloride 0.02675% Opth Soln     No facility-administered medications prior to visit.   Final Medications at End of Visit    Current Meds  Medication Sig   acetaminophen (TYLENOL) 500 MG tablet Take 500 mg by mouth every 6 (six) hours as needed.   amoxicillin-clavulanate (AUGMENTIN) 875-125 MG tablet Take 1 tablet by mouth every 12 (twelve) hours.   atorvastatin (LIPITOR) 10 MG tablet Take  10 mg by mouth daily.   ibuprofen (ADVIL) 200 MG tablet Take 200 mg by mouth every 6 (six) hours as needed. Takes Motrin brand   lisinopril (ZESTRIL) 10 MG tablet Take 10 mg by mouth daily.   OVER THE COUNTER MEDICATION Place 1 drop into both eyes in the morning and at bedtime. Medication: Eye Allergy Relief Drops. Pheniramine Maleate 0.315% and  Naphazoline Hydrochloride 0.02675% Opth Soln   Radiology:   PCV ECHOCARDIOGRAM COMPLETE  Result Date: 10/28/2020 Echocardiogram 10/28/2020: Left ventricle cavity is normal in size. Mild concentric hypertrophy of the left ventricle. Normal global wall motion. Normal LV systolic function with EF 65%. Normal diastolic filling pattern. Mild (Grade I) mitral regurgitation. Mild pulmonic regurgitation. No evidence of pulmonary hypertension.   Cardiac Studies:   None   EKG:   10/26/20: Sinus rhythm at a rate of 74 bpm.  Normal axis.  Left atrial enlargement.  Nonspecific T wave abnormality.  No evidence of ischemia or underlying injury pattern.  Compared to EKG 09/27/2020, no significant change.  Assessment     ICD-10-CM   1. Elevated troponin  R77.8 EKG 12-Lead    Brain natriuretic peptide    Troponin T    PCV ECHOCARDIOGRAM COMPLETE    2. Essential hypertension  I10     3. Other hyperlipidemia  E78.49        There are no discontinued medications.  No orders of the defined types were placed in this encounter.   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  Denies history of diabetes, MI, CVA/TIA, PE/DVT, tobacco use, alcohol use, family history of premature CAD.  Patient also has history of multiple cranial nerve palsies.   Patient presented to the ED 10/24/2020 with diplopia and worsening right eye pain and headache.  During evaluation in the emergency room troponin was mildly elevated and flat at 61-->62.  Patient's work-up in the ED was  otherwise relatively unyielding.   Patient is asymptomatic from a cardiovascular standpoint with the exception of fatigue.  However suspect fatigue is related to recent difficulty sleeping due to headache and eye pain.  Given patient's elevated troponin as well as cardiovascular risk factors including hypertension, hyperlipidemia, and sleep apnea would recommend further evaluation.  Discussed with patient further cardiovascular work-up including echocardiogram and ischemic evaluation, stress test.  As well as Troponin and BNP.  Patient is willing to proceed with stat labs as well as echocardiogram.  However he is hesitant to undergo further ischemic evaluation as he is asymptomatic and would prefer to first treat his high pain.  Discussed with patient at length regarding risks of underlying ischemia and postponing further ischemic evaluation.  Verbalized understanding, however despite these risks he still wishes to hold off on proceeding with ischemic evaluation at this time.  We will reevaluate following labs and echocardiogram.  Follow up in 6 weeks, sooner if needed, for results of cardiac testing.   Patient was seen in collaboration with Dr. Einar Gip and he is in agreement with the plan.   During this visit I reviewed and updated: Tobacco history  allergies medication reconciliation  medical history  surgical history  family history  social history.  This note was created using a voice recognition software as a result there may be grammatical errors inadvertently enclosed that do not reflect the nature of this encounter. Every attempt is made to correct such errors.   Alethia Berthold, PA-C 10/29/2020, 12:18 PM Office: [phone]  Addendum 10/26/20:  Troponin T 15, BNP 12.5  Spoke to patient on the phone regarding labs. Troponin and BNP normal. Discussed again stress test and echocardiogram. Patient agrees to have echocardiogram done, however despite recognition of ischemic risk patient  prefers to think more about stress test before going forward.   Addendum 10/29/2020:  Echocardiogram 10/28/2020:  Left ventricle cavity is normal in size. Mild concentric hypertrophy of  the left ventricle. Normal global wall motion. Normal LV systolic function  with EF 65%. Normal diastolic filling pattern.  Mild (Grade I) mitral regurgitation.  Mild pulmonic regurgitation.  No evidence of pulmonary hypertension.  Reviewed and discussed with patient results of echocardiogram. Also again discussed indication for further ischemic evaluation. Patient states he is not "up to it physically". Will therefore reevaluate if he is up to at next office visit. In the meantime counseled patients regarding signs and symptoms that would warrant urgent/emergent evaluation.

## 2020-10-27 ENCOUNTER — Telehealth: Payer: Self-pay

## 2020-10-27 LAB — TROPONIN T: Troponin T (Highly Sensitive): 15 ng/L (ref 0–22)

## 2020-10-27 LAB — BRAIN NATRIURETIC PEPTIDE: BNP: 12.5 pg/mL (ref 0.0–100.0)

## 2020-10-27 NOTE — Telephone Encounter (Signed)
Patient called requesting his most recent lab results. He said that you told him that they would return to chart by midnight last night. Please advise.

## 2020-10-27 NOTE — Telephone Encounter (Signed)
Please verfiy patient went to [facility] and had labs done yesterday. It looks like the lab orders are still active.

## 2020-10-28 ENCOUNTER — Telehealth: Payer: Self-pay | Admitting: Neurology

## 2020-10-28 ENCOUNTER — Encounter: Payer: Self-pay | Admitting: Neurology

## 2020-10-28 ENCOUNTER — Ambulatory Visit: Payer: BC Managed Care – PPO

## 2020-10-28 ENCOUNTER — Ambulatory Visit: Payer: BC Managed Care – PPO | Admitting: Neurology

## 2020-10-28 ENCOUNTER — Other Ambulatory Visit: Payer: Self-pay

## 2020-10-28 VITALS — BP 147/90 | HR 68 | Ht 72.0 in | Wt 241.0 lb

## 2020-10-28 DIAGNOSIS — H532 Diplopia: Secondary | ICD-10-CM | POA: Diagnosis not present

## 2020-10-28 DIAGNOSIS — H4901 Third [oculomotor] nerve palsy, right eye: Secondary | ICD-10-CM

## 2020-10-28 DIAGNOSIS — R5383 Other fatigue: Secondary | ICD-10-CM

## 2020-10-28 DIAGNOSIS — I10 Essential (primary) hypertension: Secondary | ICD-10-CM | POA: Diagnosis not present

## 2020-10-28 DIAGNOSIS — E785 Hyperlipidemia, unspecified: Secondary | ICD-10-CM

## 2020-10-28 DIAGNOSIS — E538 Deficiency of other specified B group vitamins: Secondary | ICD-10-CM

## 2020-10-28 DIAGNOSIS — R778 Other specified abnormalities of plasma proteins: Secondary | ICD-10-CM

## 2020-10-28 LAB — ANGIOTENSIN CONVERTING ENZYME

## 2020-10-28 MED ORDER — PREDNISONE 10 MG (21) PO TBPK: ORAL_TABLET | ORAL | 0 refills | Status: DC

## 2020-10-28 MED ORDER — CYANOCOBALAMIN 1000 MCG/ML IJ SOLN
1000.0000 ug | Freq: Once | INTRAMUSCULAR | Status: AC
  Administered 2020-10-28: 1000 ug via INTRAMUSCULAR

## 2020-10-28 MED ORDER — ETODOLAC 400 MG PO TABS: 400.0000 mg | ORAL_TABLET | Freq: Two times a day (BID) | ORAL | 5 refills | Status: DC

## 2020-10-28 NOTE — Progress Notes (Signed)
[facility]  PATIENT: [name] [name] DOB: [birth year]  REFERRING DOCTOR OR PCP: [name], MD; Octaviano Glow, MD SOURCE: Patient, notes from emergency room visit, laboratory results, imaging reports, MRI images personally reviewed.  _________________________________   HISTORICAL  CHIEF COMPLAINT:  Chief Complaint  Patient presents with   New Patient (Initial Visit)    Rm 2, w/ daughter [name] [name]. Internal referral for diplopia and right eye pn. Pt is wearing in eye patch since Monday. Right eye started to drop, yesterday. Pt c/o of HA and is having trouble functioning and is feeling "queasy" because of the movement. Reports no eye sensitivity. Pt reports on 7/24 having sinus pressure and sharp pn behind the eye. Saw his eye doc and tx for sinus infection and told to take allegra D and then saw PCP and was started on Augmentin for 10 days.     HISTORY OF PRESENT ILLNESS:  I had the pleasure of seeing your patient, [name] [name], at [facility] for neurologic consultation regarding his diplopia, ptosis and feeling poorly over the last week.  He is a 60 year old man with diplopia since last week.   Initially he had a little discomfort near the eye that has become more painful more recently.[name]   He went to the ED 10/24/20.  He had MRI orbits/brain (normal) and labs.  An LP was attempted but not performed.   He received  single IV dose of steroid.   He saw his ophthalmologist  and urgent care who felt he had a sinus infection and started Augmentin.  Right ptosis started over the last 2 days.  He feels queasy at times over the last 2 days and also feels fatigued the past past few days.   He feels bad in general the last few days due to the right head pain.    Labs 10/24/2020 showed elevated troponin. Of 61-62 and he has seen cardiology who felt no cardiologic issue.    He will be having an echocardiogram this afternoon.  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 Janann Colonel) s ago and a 4th nerve palsy OS about 16-17 years ago.   An autoimmune was suspected as the ANA was positive (ENA SSB was 4.9).  HgbA1c was negative.   ESR/CRP were normal.   He saw rheumatology but no firm diagnosis was made.    In 2010 he also had pleural effusion and right hilar lymphadenopathy.   in his lungs and was prescribed steroids x 2  He is not diabetic.   He has HTN and hyperlipidemia.   Denies significant dry eyes or dry mouth  MRI brain and MRI orbits 10/24/2020 were unremarkable.  I personally reviewed the images and concur.  There is no evidence of sinusitis.  Therefore, I have asked him to stop the Augmentin.  That may have played a role in his queasiness   REVIEW OF SYSTEMS: Constitutional: No fevers, chills, sweats, or change in appetite Eyes: No visual changes, double vision, eye pain Ear, nose and throat: No hearing loss, ear pain, nasal congestion, sore throat Cardiovascular: No chest pain, palpitations Respiratory:  No shortness of breath at rest or with exertion.   No wheezes GastrointestinaI: No nausea, vomiting, diarrhea, abdominal pain, fecal incontinence Genitourinary:  No dysuria, urinary retention or frequency.  No nocturia. Musculoskeletal:  No neck pain, back pain Integumentary: No rash, pruritus, skin lesions Neurological:  as above Psychiatric: No depression at this time.  No anxiety Endocrine: No palpitations, diaphoresis, change in appetite, change in weigh or increased thirst Hematologic/Lymphatic:  No anemia, purpura, petechiae. Allergic/Immunologic: No itchy/runny eyes, nasal congestion, recent allergic reactions, rashes  ALLERGIES: Allergies  Allergen Reactions   Erythromycin     Unknown    HOME MEDICATIONS:  Current Outpatient Medications:    acetaminophen (TYLENOL) 500 MG tablet, Take 500 mg by mouth every 6 (six) hours as needed., Disp: , Rfl:     amoxicillin-clavulanate (AUGMENTIN) 875-125 MG tablet, Take 1 tablet by mouth every 12 (twelve) hours., Disp: , Rfl:    atorvastatin (LIPITOR) 10 MG tablet, Take 10 mg by mouth daily., Disp: , Rfl:    etodolac (LODINE) 400 MG tablet, Take 1 tablet (400 mg total) by mouth 2 (two) times daily., Disp: 60 tablet, Rfl: 5   ibuprofen (ADVIL) 200 MG tablet, Take 200 mg by mouth every 6 (six) hours as needed. Takes Motrin brand, Disp: , Rfl:    lisinopril (ZESTRIL) 10 MG tablet, Take 10 mg by mouth daily., Disp: , Rfl:    OVER THE COUNTER MEDICATION, Place 1 drop into both eyes in the morning and at bedtime. Medication: Eye Allergy Relief Drops. Pheniramine Maleate 0.315% and Naphazoline Hydrochloride 0.02675% Opth Soln, Disp: , Rfl:    predniSONE (STERAPRED UNI-PAK 21 TAB) 10 MG (21) TBPK tablet, Taper from 6 po the first day to one po over 6 days, Disp: 21 tablet, Rfl: 0  PAST MEDICAL HISTORY: Past Medical History:  Diagnosis Date   Cancer ([place])    Hypertension    Seizures ([place])     PAST SURGICAL HISTORY: Past Surgical History:  Procedure Laterality Date   ABDOMINAL SURGERY     BRAIN SURGERY      FAMILY HISTORY: Family History  Problem Relation Age of Onset   Migraines Mother    Stroke Father    High Cholesterol Father    Diabetes Father    Hypertension Maternal Grandmother    Heart disease Maternal Grandfather    Stroke Paternal Grandfather     SOCIAL HISTORY:  Social History   Socioeconomic History   Marital status: Married    Spouse name: [name]   Number of children: 2   Years of education: Master's   Highest education level: Not on file  Occupational History    Employer: [employer]  Tobacco Use   Smoking status: Former    Types: Cigarettes   Smokeless tobacco: Never  Vaping Use   Vaping Use: Never used  Substance and Sexual Activity   Alcohol use: No   Drug use: No   Sexual activity: Not on file  Other Topics Concern   Not on file  Social  History Narrative   Patient is married to [name] [name]), has 2 childrenPatient is right handedEducation level is Paediatric nurse consumption is 2-4 cups daily   Lives with wife   Right handed   Caffeine: 1-2 coke a day   Social Determinants of Health   Financial Resource Strain: Not on file  Food Insecurity: Not on file  Transportation Needs: Not on file  Physical Activity: Not on file  Stress: Not on file  Social Connections: Not on file  Intimate Partner Violence: Not on file     PHYSICAL EXAM  Vitals:   10/28/20 0911  BP: (!) 147/90  Pulse: 68  Weight: 241 lb (109.3 kg)  Height: 6' (1.829 m)    Body mass index is 32.69  kg/m.   General: The patient is well-developed and well-nourished and in no acute distress  HEENT:  Head is [place]/AT.  Sclera are anicteric.  Funduscopic exam shows normal optic discs and retinal vessels.  Neck: No carotid bruits are noted.  The neck is nontender.  Cardiovascular: The heart has a regular rate and rhythm with a normal S1 and S2. There were no murmurs, gallops or rubs.    Skin: Extremities are without rash or  edema.  Musculoskeletal:  Back is nontender  Neurologic Exam  Mental status: The patient is alert and oriented x 3 at the time of the examination. The patient has apparent normal recent and remote memory, with an apparently normal attention span and concentration ability.   Speech is normal.  Cranial nerves:    He has Dysconjugate gaze associated with diplopia up to the left and lateral to the left    he also has mild right ptosis.   Pupils are equal, round, and reactive to light and accomodation.  Visual fields are full.  Facial symmetry is present.  He reports reduced facial sensation on the right.  Facial strength is normal.  Trapezius and sternocleidomastoid strength is normal. No dysarthria is noted.  The tongue is midline, and the patient has symmetric elevation of the soft palate. No obvious hearing deficits are  noted.  Motor:  Muscle bulk is normal.   Tone is normal. Strength is  5 / 5 in all 4 extremities.   Sensory: He reports reduced sensation to temperature in the right arm and leg but symmetric vibration sensation.  Coordination: Cerebellar testing reveals good finger-nose-finger and heel-to-shin bilaterally.  Gait and station: Station is normal.   Gait is normal. Tandem gait is slightly wide. Romberg is negative.   Reflexes: Deep tendon reflexes are symmetric and normal bilaterally.   Plantar responses are flexor.    DIAGNOSTIC DATA (LABS, IMAGING, TESTING) - I reviewed patient records, labs, notes, testing and imaging myself where available.  Lab Results  Component Value Date   WBC 7.9 10/24/2020   HGB 14.9 10/24/2020   HCT 43.4 10/24/2020   MCV 88.8 10/24/2020   PLT 321 10/24/2020      Component Value Date/Time   NA 136 10/24/2020 1519   K 3.5 10/24/2020 1519   CL 104 10/24/2020 1519   CO2 23 10/24/2020 1519   GLUCOSE 114 (H) 10/24/2020 1519   BUN 8 10/24/2020 1519   CREATININE 1.02 10/24/2020 1519   CALCIUM 9.0 10/24/2020 1519   PROT 6.4 (L) 10/24/2020 1519   ALBUMIN 3.7 10/24/2020 1519   AST 22 10/24/2020 1519   ALT 23 10/24/2020 1519   ALKPHOS 105 10/24/2020 1519   BILITOT 0.8 10/24/2020 1519   GFRNONAA >60 10/24/2020 1519   GFRAA  10/21/2008 1000    >60        The eGFR has been calculated using the MDRD equation. This calculation has not been validated in all clinical situations. eGFR's persistently <60 mL/min signify possible Chronic Kidney Disease.   No results found for: CHOL, HDL, LDLCALC, LDLDIRECT, TRIG, CHOLHDL  Lab Results  Component Value Date   VITAMINB12 127 (L) 10/24/2020   No results found for: TSH     ASSESSMENT AND PLAN  Diplopia - Plan: ANA w/Reflex, Sjogren's syndrome antibods(ssa + ssb), Angiotensin converting enzyme, QuantiFERON-TB Gold Plus, Complement, total, C3 and C4, Intrinsic Factor Antibodies, Thyroid Panel With  TSH  Right oculomotor nerve palsy - Plan: ANA w/Reflex, Sjogren's syndrome antibods(ssa + ssb), Angiotensin  converting enzyme, QuantiFERON-TB Gold Plus, Complement, total, C3 and C4, Intrinsic Factor Antibodies, Thyroid Panel With TSH  Other fatigue - Plan: ANA w/Reflex, Angiotensin converting enzyme, Thyroid Panel With TSH  Essential hypertension  Hyperlipidemia, unspecified hyperlipidemia type  B12 deficiency   In summary, Mr. Chiriboga is a 60 year old man with diplopia that appears to be due to a partial right 3rd nerve palsy.  He also has mild ptosis on the left side.  Additionally, he has right facial pain and feelings of more fatigue and queasiness over the past few days.  Partial 3rd nerve palsies can be due to ischemia and inflammatory lesions.  Interestingly, he has had other cranial nerve palsies in the past which might favor an inflammatory etiology.  Of note, in 2015 when he had a left 6 th cranial nerve palsy on the left SSB was elevated but apparently there were no other signs of Sjogren's syndrome.  Prednisone 6-day pack and etodolac to help with the pain. We will check blood work including ANA, SSA/SSB, QuantiFERON-TB, angiotensin-converting enzyme, thyroid panel, C3, C4 CH 50 B12 is moderately low.  I had him get a shot of 1000 mcg oral in the office and advised him to take over-the-counter vitamin B12.  This can be rechecked in a few months.  We will also check the intrinsic factor antibodies Myasthenia gravis is less likely given his history but I would consider testing for this if symptoms persist or generalized weakness develops.  If he is no better next week, consider lumbar puncture to analyze CSF for inflammation and for cytopathology    He will return to see me in 3 weeks or call if not any better.  We will let him know the results of the lab work.  Thank you for asking me to see Mr. Lanuza.  Please let me know if I can be of further assistance with him or other  patients in the future.   Chaniqua Brisby A. Felecia Shelling, MD, [facility] 06/03/9371, 42:87 AM Certified in Neurology, Clinical Neurophysiology, Sleep Medicine and Neuroimaging  [facility] [address], [place], [place] [address] [phone]

## 2020-10-28 NOTE — Progress Notes (Signed)
Called and spoke with patient 5:04 pm 10/27/20 and informed him of results. Patient verbalized understanding.

## 2020-10-28 NOTE — Telephone Encounter (Signed)
Per checkout notes, pt needs to f/u in 3 weeks with Dr. Felecia Shelling. There are no open OV slots until December. Please advise.

## 2020-10-28 NOTE — Addendum Note (Signed)
Addended by: Darleen Crocker on: 10/28/2020 10:40 AM   Modules accepted: Orders

## 2020-10-28 NOTE — Telephone Encounter (Signed)
Called the patient back to advise we could see about getting him into a work in slot. Advised that earliest would be aug 29,2022. Pt requested the visit be as late in the day as possible. Informed the patient that with me working him in to be seen it would be hard to guarantee getting him in. Advised the patient that I would tentatively place him on the schedule for 4 pm. Advised that if Dr Felecia Shelling is unable to do that time, I will call him back to get it moved up earlier in the day. Pt verbalized understanding and was appreciative. Pt aware if I don't call back the apt time of 4 pm will remain.

## 2020-11-04 ENCOUNTER — Telehealth: Payer: Self-pay | Admitting: Neurology

## 2020-11-04 ENCOUNTER — Encounter: Payer: Self-pay | Admitting: Neurology

## 2020-11-04 DIAGNOSIS — R519 Headache, unspecified: Secondary | ICD-10-CM

## 2020-11-04 DIAGNOSIS — H532 Diplopia: Secondary | ICD-10-CM

## 2020-11-04 DIAGNOSIS — H4901 Third [oculomotor] nerve palsy, right eye: Secondary | ICD-10-CM

## 2020-11-04 LAB — ENA+DNA/DS+SJORGEN'S
ENA RNP Ab: 1.5 AI — ABNORMAL HIGH (ref 0.0–0.9)
ENA SM Ab Ser-aCnc: <0.2 AI (ref 0.0–0.9)
dsDNA Ab: <1 IU/mL (ref 0–9)

## 2020-11-04 LAB — ANA W/REFLEX: Anti Nuclear Antibody (ANA): POSITIVE — AB

## 2020-11-04 LAB — C3 AND C4
Complement C3, Serum: 153 mg/dL (ref 82–167)
Complement C4, Serum: 26 mg/dL (ref 12–38)

## 2020-11-04 LAB — QUANTIFERON-TB GOLD PLUS
QuantiFERON Mitogen Value: >10 IU/mL
QuantiFERON Nil Value: 0.05 IU/mL
QuantiFERON TB1 Ag Value: 0.11 IU/mL
QuantiFERON TB2 Ag Value: 0.47 IU/mL
QuantiFERON-TB Gold Plus: POSITIVE — AB

## 2020-11-04 LAB — ANGIOTENSIN CONVERTING ENZYME: Angio Convert Enzyme: 6 U/L — ABNORMAL LOW (ref 14–82)

## 2020-11-04 LAB — SJOGREN'S SYNDROME ANTIBODS(SSA + SSB)
ENA SSA (RO) Ab: <0.2 AI (ref 0.0–0.9)
ENA SSB (LA) Ab: 1.6 AI — ABNORMAL HIGH (ref 0.0–0.9)

## 2020-11-04 LAB — THYROID PANEL WITH TSH
Free Thyroxine Index: 2.2 (ref 1.2–4.9)
T3 Uptake Ratio: 29 % (ref 24–39)
T4, Total: 7.5 ug/dL (ref 4.5–12.0)
TSH: 1.24 u[IU]/mL (ref 0.450–4.500)

## 2020-11-04 LAB — INTRINSIC FACTOR ANTIBODIES: Intrinsic Factor Abs, Serum: 15.2 AU/mL — ABNORMAL HIGH (ref 0.0–1.1)

## 2020-11-04 LAB — COMPLEMENT, TOTAL: Compl, Total (CH50): >60 U/mL (ref >41)

## 2020-11-04 NOTE — Telephone Encounter (Signed)
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  Although this could be a false positive, I feel we need to check a lumbar puncture to make sure that there is not an infectious cause to his cranial nerve palsy.  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  The SSB was elevated again.  This was elevated 7 years ago and he did get a referral to rheumatology and felt not to have Sjogren's.  Of note, he does not get dry mouth and dry eyes so this could be a false positive.  He had had a low B12 level.  I checked anti-intrinsic factor antibodies and they were positive.  He needs to start B12 injections.  We will have him come in sometime in the next week or so to get trained.

## 2020-11-04 NOTE — Progress Notes (Signed)
   Dr. Marisue Humble:  I saw your patient, [name] [name], last week for a cranial nerve palsy and multiple labs were performed.  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  Although this could be a false positive, I feel we need to check a lumbar puncture to make sure that there is not an infectious cause to his cranial nerve palsy.  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  We will set this up.  The SSB was elevated again.  This was elevated 7 years ago and he did get a referral to rheumatology and felt not to have Sjogren's.  Of note, he does not get dry mouth and dry eyes so this could be a false positive.  He had had a low B12 level we recently checked some labs.  Because he has no history of bowel disease or GI surgery, I checked anti-intrinsic factor antibodies and they were positive.   We will have him come in sometime in the next week or so to get trained to give injections.  I am uncertain if further evaluation is necessary and help that you could address this if further referral is needed.  We will let you know if the lumbar puncture shows any significant abnormalities.  Sincerely,   [name] Gerst A. Felecia Shelling, MD, PhD, FAAN Certified in Neurology, Clinical Neurophysiology, Sleep Medicine, Pain Medicine and Neuroimaging Director, [place] at [facility]

## 2020-11-04 NOTE — Telephone Encounter (Signed)
Pt called, have questions about some medications.etodolac (LODINE) 400 MG tablet want to know if I can stop taking when I feel better, then start taking if need to? predniSONE (STERAPRED UNI-PAK 21 TAB) 10 MG (21) TBPK tablet how long would I have to worry about this medication compromising my immune system? Would like a call from the nurse.

## 2020-11-04 NOTE — Telephone Encounter (Signed)
Called and spoke with pt. Advised he is able to stop etodolac if sx have improved. Can also restart if needed. He finished prednisone taper yesterday and it did help improve sx. Advised long term steroid use can affect immune system. His was a short course. He should continue to take caution d/t covid-19 and avoid large crowds/wear mask. He verbalized understanding.

## 2020-11-06 ENCOUNTER — Ambulatory Visit
Admission: RE | Admit: 2020-11-06 | Discharge: 2020-11-06 | Disposition: A | Discharge status: Home / Self Care | Payer: BC Managed Care – PPO | Source: Ambulatory Visit | Attending: Neurology | Admitting: Neurology

## 2020-11-06 ENCOUNTER — Other Ambulatory Visit (HOSPITAL_COMMUNITY)
Admission: RE | Admit: 2020-11-06 | Discharge: 2020-11-06 | Disposition: A | Discharge status: Home / Self Care | Payer: BC Managed Care – PPO | Source: Ambulatory Visit | Attending: Neurology | Admitting: Neurology

## 2020-11-06 ENCOUNTER — Other Ambulatory Visit: Payer: Self-pay

## 2020-11-06 VITALS — BP 159/91 | HR 67

## 2020-11-06 DIAGNOSIS — R519 Headache, unspecified: Secondary | ICD-10-CM

## 2020-11-06 DIAGNOSIS — H532 Diplopia: Secondary | ICD-10-CM | POA: Diagnosis not present

## 2020-11-06 DIAGNOSIS — H4901 Third [oculomotor] nerve palsy, right eye: Secondary | ICD-10-CM

## 2020-11-06 NOTE — Discharge Instructions (Signed)

## 2020-11-09 ENCOUNTER — Other Ambulatory Visit: Payer: BC Managed Care – PPO

## 2020-11-10 LAB — CYTOLOGY - NON PAP

## 2020-11-11 ENCOUNTER — Telehealth: Payer: Self-pay | Admitting: Neurology

## 2020-11-11 NOTE — Telephone Encounter (Signed)
Pt called asking bout his lumbar puncture results and also wanting to know about his B 12 shot that he is supposed to be getting. Pt requesting a call back.

## 2020-11-11 NOTE — Telephone Encounter (Signed)
Dr. Felecia Shelling- I reviewed pt chart. He is asking for LP results.   I also saw your previous note: "He had had a low B12 level.  I checked anti-intrinsic factor antibodies and they were positive.  He needs to start B12 injections.  We will have him come in sometime in the next week or so to get trained."   Did you want to have him come in here to get trained to do B12 injections? Or get set up through his PCP office for these?

## 2020-11-12 ENCOUNTER — Other Ambulatory Visit: Payer: Self-pay | Admitting: *Deleted

## 2020-11-12 MED ORDER — PREDNISONE 10 MG (21) PO TBPK: ORAL_TABLET | ORAL | 0 refills | Status: DC

## 2020-11-12 NOTE — Telephone Encounter (Signed)
Called pt back. Relayed Dr. Garth Bigness message. His wife will not let him drive and she is unable to bring him today to be shown B12 inj. He would like to try to do these himself at home if possible. Made tentative nurse appt for 11/17/20 at 8am. He will verify w/ wife to make sure this works.   He is still wearing patch over eye. Weakness better, still has double vision but today has been the best day for this. Has headache still but has improved since start of sx. Still taking pain med prn. 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 Advised I will discuss w/ Dr. Felecia Shelling and call back to let him know plan.

## 2020-11-12 NOTE — Telephone Encounter (Signed)
Called pt back. Advised per Dr. Felecia Shelling he would like him to take another steroid pack. Pt will pick this up today and start tomorrow. He will keep scheduled appt next week to do B12 inj. Relayed per Dr. Felecia Shelling: "LP showed no evidence of TB ---- the microscope was normal but culture takes a few weeks. Cells were fine so TB very unlikely". He will discuss whether he is safe to drive or not at upcoming appt on 11/23/20 w/ MD.

## 2020-11-17 ENCOUNTER — Other Ambulatory Visit: Payer: Self-pay | Admitting: Neurology

## 2020-11-17 ENCOUNTER — Ambulatory Visit (INDEPENDENT_AMBULATORY_CARE_PROVIDER_SITE_OTHER): Payer: BC Managed Care – PPO | Admitting: *Deleted

## 2020-11-17 ENCOUNTER — Other Ambulatory Visit: Payer: Self-pay

## 2020-11-17 DIAGNOSIS — E538 Deficiency of other specified B group vitamins: Secondary | ICD-10-CM | POA: Diagnosis not present

## 2020-11-17 MED ORDER — CYANOCOBALAMIN 1000 MCG/ML IJ SOLN: INTRAMUSCULAR | 0 refills | Status: DC

## 2020-11-17 MED ORDER — CYANOCOBALAMIN 1000 MCG/ML IJ SOLN
1000.0000 ug | Freq: Once | INTRAMUSCULAR | Status: AC
  Administered 2020-11-17: 1000 ug via INTRAMUSCULAR

## 2020-11-17 NOTE — Progress Notes (Signed)
Gave Vitamin B12 IM in right deltoid. Cleaned with alcohol wipe prior to injection. Band-aid applied. Pt tolerated well.   Instructions provided to pt/wife (printed out form). Aware we will call in script to pharmacy.

## 2020-11-23 ENCOUNTER — Encounter: Payer: Self-pay | Admitting: Neurology

## 2020-11-23 ENCOUNTER — Ambulatory Visit: Payer: BC Managed Care – PPO | Admitting: Neurology

## 2020-11-23 ENCOUNTER — Other Ambulatory Visit: Payer: Self-pay

## 2020-11-23 VITALS — BP 120/76 | HR 72 | Ht 72.0 in | Wt 241.0 lb

## 2020-11-23 DIAGNOSIS — R519 Headache, unspecified: Secondary | ICD-10-CM | POA: Insufficient documentation

## 2020-11-23 DIAGNOSIS — R7612 Nonspecific reaction to cell mediated immunity measurement of gamma interferon antigen response without active tuberculosis: Secondary | ICD-10-CM | POA: Insufficient documentation

## 2020-11-23 DIAGNOSIS — H4901 Third [oculomotor] nerve palsy, right eye: Secondary | ICD-10-CM

## 2020-11-23 DIAGNOSIS — H532 Diplopia: Secondary | ICD-10-CM

## 2020-11-23 DIAGNOSIS — E538 Deficiency of other specified B group vitamins: Secondary | ICD-10-CM | POA: Diagnosis not present

## 2020-11-23 DIAGNOSIS — R768 Other specified abnormal immunological findings in serum: Secondary | ICD-10-CM | POA: Insufficient documentation

## 2020-11-23 DIAGNOSIS — R5383 Other fatigue: Secondary | ICD-10-CM | POA: Diagnosis not present

## 2020-11-23 NOTE — Progress Notes (Signed)
[facility]  PATIENT: [name] [name] DOB: Apr 10, 1960  REFERRING DOCTOR OR PCP: [name] Arabian, MD; [name] Glow, MD SOURCE: Patient, notes from emergency room visit, laboratory results, imaging reports, MRI images personally reviewed.  _________________________________   HISTORICAL  CHIEF COMPLAINT:  Chief Complaint  Patient presents with   Follow-up    RM 1, with wife. Doing better. Still wearing patch to control visual disturbances.    HISTORY OF PRESENT ILLNESS:  Since last visit, he has noted some improvement.  He continues to have some diplopia but it is intermittent.  Because it is bothersome at times he is still not driving and wears a patch.  He notes visual changes as well.   Light seems more dim out of the left eye but colors are unchanged. Since the last visit, he has had additional blood work and lumbar puncture.  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  ANA was positive in the SSB was positive at 1.6 (it had been 4.97 years ago) SSA was negative.  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  Because of the cranial nerve palsy and the positive quant to [name] TB Gold test, a lumbar puncture was performed.  White blood cells were 2, protein, glucose, HSV 1 PCR, HSV 2 PCR, AFB stain, cytopathology was negative.  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   Initially he had a discomfort near the eye became more painful as the diplopia developed.   He went to the ED 10/24/20.  He had MRI orbits/brain (normal) and labs.  An LP was attempted but not completed.   He received a single IV dose of steroid.  While in the emergency room, he was also noted to have elevated troponin and was referred to cardiology.  He saw his ophthalmologist  and urgent care who felt he had a sinus infection and started Augmentin.   Besides the diplopia and pain, he also noted reduced appetite and  fatigue.  He had a 6th nerve palsy OS March 2015 (saw [name] [name])  and a 4th nerve palsy OS around 2005.   An autoimmune was suspected as the ANA was positive (ENA SSB was 4.9).  HgbA1c was negative.   ESR/CRP were normal.   He saw rheumatology but no firm diagnosis was made.    In 2010 he also had pleural effusion and right hilar lymphadenopathy in his lungs and was prescribed steroids x 2  He is not diabetic.   He has HTN and hyperlipidemia.   Denies significant dry eyes or dry mouth  Studies: MRI brain and MRI orbits 10/24/2020 were unremarkable.  I personally reviewed the images and concur.  There is no evidence of sinusitis.    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  ANA was positive in the SSB was positive at 1.6 (it had been 4.97 years ago) SSA was negative.    REVIEW OF SYSTEMS: Constitutional: No fevers, chills, sweats, or change in appetite Eyes: No visual changes, double vision, eye pain Ear, nose and throat: No hearing loss, ear pain, nasal congestion, sore throat Cardiovascular: No chest pain, palpitations Respiratory:  No shortness of breath at rest or with exertion.   No wheezes GastrointestinaI: No nausea, vomiting, diarrhea, abdominal pain, fecal incontinence Genitourinary:  No dysuria, urinary retention or frequency.  No nocturia.  Musculoskeletal:  No neck pain, back pain Integumentary: No rash, pruritus, skin lesions Neurological: as above Psychiatric: No depression at this time.  No anxiety Endocrine: No palpitations, diaphoresis, change in appetite, change in weigh or increased thirst Hematologic/Lymphatic:  No anemia, purpura, petechiae. Allergic/Immunologic: No itchy/runny eyes, nasal congestion, recent allergic reactions, rashes  ALLERGIES: Allergies  Allergen Reactions   Erythromycin     Unknown     HOME MEDICATIONS:  Current Outpatient Medications:    acetaminophen (TYLENOL) 500 MG tablet, Take 500 mg by mouth every 6 (six) hours as needed., Disp: , Rfl:    atorvastatin (LIPITOR) 10 MG tablet, Take 10 mg by mouth daily., Disp: , Rfl:    cyanocobalamin (,VITAMIN B-12,) 1000 MCG/ML injection, Inject 1 ml IM every week x 4 weeks then once a month.   Dispense with #15 1 cc syringes and 1 inch 27g needles, Disp: 15 mL, Rfl: 0   etodolac (LODINE) 400 MG tablet, Take 1 tablet (400 mg total) by mouth 2 (two) times daily., Disp: 60 tablet, Rfl: 5   ibuprofen (ADVIL) 200 MG tablet, Take 200 mg by mouth every 6 (six) hours as needed. Takes Motrin brand, Disp: , Rfl:    lisinopril (ZESTRIL) 10 MG tablet, Take 10 mg by mouth daily., Disp: , Rfl:    OVER THE COUNTER MEDICATION, Place 1 drop into both eyes in the morning and at bedtime. Medication: Eye Allergy Relief Drops. Pheniramine Maleate 0.315% and Naphazoline Hydrochloride 0.02675% Opth Soln, Disp: , Rfl:   PAST MEDICAL HISTORY: Past Medical History:  Diagnosis Date   Cancer ([place])    Hypertension    Seizures ([place])     PAST SURGICAL HISTORY: Past Surgical History:  Procedure Laterality Date   ABDOMINAL SURGERY     BRAIN SURGERY      FAMILY HISTORY: Family History  Problem Relation Age of Onset   Migraines Mother    Stroke Father    High Cholesterol Father    Diabetes Father    Hypertension Maternal Grandmother    Heart disease Maternal Grandfather    Stroke Paternal Grandfather     SOCIAL HISTORY:  Social History   Socioeconomic History   Marital status: Married    Spouse name: [name] [name]   Number of children: 2   Years of education: Master's   Highest education level: Not on file  Occupational History    Employer: [employer]  Tobacco Use   Smoking status: Former    Types: Cigarettes   Smokeless tobacco: Never  Vaping Use   Vaping Use: Never used  Substance and Sexual Activity   Alcohol use: No    Drug use: No   Sexual activity: Not on file  Other Topics Concern   Not on file  Social History Narrative   Patient is married to [name] [name]), has 2 childrenPatient is right handedEducation level is Paediatric nurse consumption is 2-4 cups daily   Lives with wife   Right handed   Caffeine: 1-2 coke a day   Social Determinants of Radio broadcast assistant Strain: Not on file  Food Insecurity: Not on file  Transportation Needs: Not on file  Physical Activity: Not on file  Stress: Not on file  Social Connections: Not on file  Intimate Partner Violence: Not on file     PHYSICAL EXAM  Vitals:   11/23/20 1600  BP: 120/76  Pulse: 72  Weight: 241 lb (109.3 kg)  Height: 6' (1.829 m)    Body mass index  is 32.69 kg/m.   General: The patient is well-developed and well-nourished and in no acute distress  HEENT:  Head is [place]/AT.  Sclera are anicteric.    Neck:  .  The neck is nontender.   Skin: Extremities are without rash or  edema.  Musculoskeletal:  Back is nontender  Neurologic Exam  Mental status: The patient is alert and oriented x 3 at the time of the examination. The patient has apparent normal recent and remote memory, with an apparently normal attention span and concentration ability.   Speech is normal.  Cranial nerves:    There is slight disconjugate gaze with diplopia looking to the left.  I did not detect any disconjugate gaze looking to the right or looking up.  There is just very minimal right ptosis.   Pupils are equal, round, and reactive to light and accomodation.    Facial symmetry is present.  He reports reduced facial sensation on the right.  Facial strength is normal.  Trapezius and sternocleidomastoid strength is normal. No dysarthria is noted.  The tongue is midline, and the patient has symmetric elevation of the soft palate. No obvious hearing deficits are noted.  Motor:  Muscle bulk is normal.   Tone is normal. Strength is  5 / 5 in all 4  extremities.   Sensory: He reports reduced sensation to temperature in the right arm and leg but symmetric vibration sensation.  Coordination: Cerebellar testing reveals good finger-nose-finger and heel-to-shin bilaterally.  Gait and station: Station is normal.   Gait is normal. Tandem gait is slightly wide. Romberg is negative.   Reflexes: Deep tendon reflexes are symmetric and normal bilaterally.       DIAGNOSTIC DATA (LABS, IMAGING, TESTING) - I reviewed patient records, labs, notes, testing and imaging myself where available.  Lab Results  Component Value Date   WBC 7.9 10/24/2020   HGB 14.9 10/24/2020   HCT 43.4 10/24/2020   MCV 88.8 10/24/2020   PLT 321 10/24/2020      Component Value Date/Time   NA 136 10/24/2020 1519   K 3.5 10/24/2020 1519   CL 104 10/24/2020 1519   CO2 23 10/24/2020 1519   GLUCOSE 114 (H) 10/24/2020 1519   BUN 8 10/24/2020 1519   CREATININE 1.02 10/24/2020 1519   CALCIUM 9.0 10/24/2020 1519   PROT 6.4 (L) 10/24/2020 1519   ALBUMIN 3.7 10/24/2020 1519   AST 22 10/24/2020 1519   ALT 23 10/24/2020 1519   ALKPHOS 105 10/24/2020 1519   BILITOT 0.8 10/24/2020 1519   GFRNONAA >60 10/24/2020 1519   GFRAA  10/21/2008 1000    >60        The eGFR has been calculated using the MDRD equation. This calculation has not been validated in all clinical situations. eGFR's persistently <60 mL/min signify possible Chronic Kidney Disease.   No results found for: CHOL, HDL, LDLCALC, LDLDIRECT, TRIG, CHOLHDL  Lab Results  Component Value Date   VITAMINB12 127 (L) 10/24/2020   Lab Results  Component Value Date   TSH 1.240 10/28/2020       ASSESSMENT AND PLAN  Right oculomotor nerve palsy - Plan: Lyme Disease Serology w/Reflex, Lyme Disease, Western Blot  B12 deficiency  Diplopia  Other fatigue  Nonintractable headache, unspecified chronicity pattern, unspecified headache type  ANA positive - SSB elevated  Positive QuantiFERON-TB Gold  test  His partial 3rd nerve palsy on the right is improving.  We discussed that he should try to spend part of the  day without wearing an eye patch and also rotate the eye patch between left and right.  Since he is improving there is a good chance that he will return to baseline.  He has a very unusual history and that this is his third isolated cranial nerve palsy (left fourth and right 6th in the past) a definite etiology has not been determined though he does have some abnormal lab test including a positive QuantiFERON TB (though MRI and CSF studies do not show any evidence of CNS TB).  Additionally the SSB is positive though he does not have other symptoms of Sjogren's disease.  We will check the Lyme antibodies and treat with doxycycline if the Western blot is positive.  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   Since this is rare, the best treatment is uncertain.  However, if he has an additional episode of cranial nerve palsies or if he has similar severe headache, he should rapidly go on a steroid pack and valacyclovir (or similar) He has B12 deficiency that appears to be due to the presence of anti-intrinsic factor antibodies.  He has been started on B12 shots.  He should continue these. The QuantiFERON-TB test was positive though he has no history of TB.  Because of the appearance of the MRI and the CSF studies to date, I do not think he has central nervous system TB. He will return to see [place] as needed and we will let him know the final result of the AFB culture when we get the results.      Mickaela Starlin A. Felecia Shelling, MD, [facility] 3/84/6659, 9:35 PM Certified in Neurology, Clinical Neurophysiology, Sleep Medicine and Neuroimaging  [facility] [address] [place], [place] [address] [phone]

## 2020-11-23 NOTE — Progress Notes (Signed)
Patient and wife requested that I give patient his vitamin B12 injection here in the office so the patient and his wife can watch and feel comfortable doing at home.  Patient provided the vitamin B12 1000 mcg/mL vial.  Injected 1000 mcg of vitamin B12 into his left deltoid using aseptic technique.  Patient tolerated well.  Bandage applied.  Dodex vitamin B12 1036mg LET:[id number]: 04/2022

## 2020-12-01 ENCOUNTER — Other Ambulatory Visit (INDEPENDENT_AMBULATORY_CARE_PROVIDER_SITE_OTHER): Payer: Self-pay

## 2020-12-01 DIAGNOSIS — Z0289 Encounter for other administrative examinations: Secondary | ICD-10-CM

## 2020-12-01 DIAGNOSIS — H4901 Third [oculomotor] nerve palsy, right eye: Secondary | ICD-10-CM

## 2020-12-02 LAB — LYME DISEASE SEROLOGY W/REFLEX: Lyme Total Antibody EIA: NEGATIVE

## 2020-12-07 ENCOUNTER — Ambulatory Visit: Payer: BC Managed Care – PPO | Admitting: Student

## 2020-12-08 LAB — LYME DISEASE, WESTERN BLOT
IgG P18 Ab.: ABSENT
IgG P23 Ab.: ABSENT
IgG P28 Ab.: ABSENT
IgG P30 Ab.: ABSENT
IgG P39 Ab.: ABSENT
IgG P45 Ab.: ABSENT
IgG P66 Ab.: ABSENT
IgG P93 Ab.: ABSENT
IgM P23 Ab.: ABSENT
IgM P39 Ab.: ABSENT
IgM P41 Ab.: ABSENT
Lyme IgG Wb: NEGATIVE
Lyme IgM Wb: NEGATIVE

## 2020-12-09 ENCOUNTER — Telehealth: Payer: Self-pay | Admitting: *Deleted

## 2020-12-09 NOTE — Telephone Encounter (Signed)
-----   Message from Britt Bottom, MD sent at 12/07/2020 11:41 AM EDT ----- Please let him know that the Lyme disease test looked normal

## 2020-12-09 NOTE — Telephone Encounter (Signed)
Called patient and LVM (ok per DPR) advising his Lyme disease test looked normal per Dr. Felecia Shelling.  I left the office number for him to call back if he has any questions.

## 2020-12-22 LAB — CSF CELL COUNT WITH DIFFERENTIAL
RBC Count, CSF: 1 cells/uL — ABNORMAL HIGH
WBC, CSF: 2 cells/uL (ref 0–5)

## 2020-12-22 LAB — HERPES SIMPLEX VIRUS, TYPE 1 AND 2 DNA,QUAL,RT PCR
HSV 1 DNA: NOT DETECTED
HSV 2 DNA: NOT DETECTED

## 2020-12-22 LAB — FUNGUS CULTURE W SMEAR
CULTURE:: NO GROWTH
MICRO NUMBER:: 12236090
SMEAR:: NONE SEEN
SPECIMEN QUALITY:: ADEQUATE

## 2020-12-22 LAB — PROTEIN, CSF: Total Protein, CSF: 40 mg/dL (ref 15–45)

## 2020-12-22 LAB — MYCOBACTERIA,CULT W/FLUOROCHROME SMEAR
MICRO NUMBER:: 12236091
SMEAR:: NONE SEEN
SPECIMEN QUALITY:: ADEQUATE

## 2020-12-22 LAB — CSF CULTURE W GRAM STAIN
MICRO NUMBER:: 12236092
Result:: NO GROWTH
SPECIMEN QUALITY:: ADEQUATE

## 2020-12-22 LAB — GLUCOSE, CSF: Glucose, CSF: 62 mg/dL (ref 40–80)

## 2021-11-06 ENCOUNTER — Emergency Department (HOSPITAL_BASED_OUTPATIENT_CLINIC_OR_DEPARTMENT_OTHER)
Admission: EM | Admit: 2021-11-06 | Discharge: 2021-11-07 | Disposition: A | Discharge status: Home / Self Care | Payer: BC Managed Care – PPO | Attending: Emergency Medicine | Admitting: Emergency Medicine

## 2021-11-06 ENCOUNTER — Other Ambulatory Visit: Payer: Self-pay

## 2021-11-06 DIAGNOSIS — I10 Essential (primary) hypertension: Secondary | ICD-10-CM | POA: Insufficient documentation

## 2021-11-06 DIAGNOSIS — Z79899 Other long term (current) drug therapy: Secondary | ICD-10-CM | POA: Diagnosis not present

## 2021-11-06 DIAGNOSIS — R339 Retention of urine, unspecified: Secondary | ICD-10-CM | POA: Diagnosis not present

## 2021-11-06 DIAGNOSIS — Z87891 Personal history of nicotine dependence: Secondary | ICD-10-CM | POA: Diagnosis not present

## 2021-11-06 DIAGNOSIS — Z859 Personal history of malignant neoplasm, unspecified: Secondary | ICD-10-CM | POA: Insufficient documentation

## 2021-11-06 DIAGNOSIS — K5641 Fecal impaction: Secondary | ICD-10-CM | POA: Diagnosis not present

## 2021-11-06 DIAGNOSIS — K5900 Constipation, unspecified: Secondary | ICD-10-CM | POA: Diagnosis present

## 2021-11-06 NOTE — ED Provider Notes (Signed)
[facility] Provider Note: Georgena Spurling, MD, FACEP  CSN: [id number] MRN: [id number] ARRIVAL: 11/06/21 at 2211 ROOM: DB016/DB016   CHIEF COMPLAINT  Constipation   HISTORY OF PRESENT ILLNESS  11/06/21 11:54 PM [name] [name] is a 61 y.o. male with right-sided Bell's palsy on 10/26/2021 and had surgery for detached retina on the left 11/02/2021.  He had to have repeat surgery the next day due to a complication.  He is here now with several days of constipation.  He is having pain in his rectum and has been attempting to move his bowels but is having difficulty because he is not permitted to Valsalva due to retained gas in his eye.  He also feels like he cannot urinate and when he voids his bladder he feels it is not emptying.  Nursing staff scanned his bladder prior to my evaluation and found about 662 mL of retained urine.  He is also having some pain in his left flank which she describes as muscular in nature and believes this is due to contorting his body in an attempt to move his bowels.   Past Medical History:  Diagnosis Date   Cancer ([place])    Hypertension    Seizures ([place])     Past Surgical History:  Procedure Laterality Date   ABDOMINAL SURGERY     BRAIN SURGERY      Family History  Problem Relation Age of Onset   Migraines Mother    Stroke Father    High Cholesterol Father    Diabetes Father    Hypertension Maternal Grandmother    Heart disease Maternal Grandfather    Stroke Paternal Grandfather     Social History   Tobacco Use   Smoking status: Former    Types: Cigarettes   Smokeless tobacco: Never  Vaping Use   Vaping Use: Never used  Substance Use Topics   Alcohol use: No   Drug use: No    Prior to Admission medications   Medication Sig Start Date End Date Taking? Authorizing Provider  acetaminophen (TYLENOL) 500 MG tablet Take 500 mg by mouth every 6 (six) hours as needed.    [provider]  atorvastatin (LIPITOR) 10 MG tablet Take  10 mg by mouth daily. 03/16/20   [provider]  cyanocobalamin (,VITAMIN B-12,) 1000 MCG/ML injection Inject 1 ml IM every week x 4 weeks then once a month.   Dispense with #15 1 cc syringes and 1 inch 27g needles 11/17/20   Sater, Nanine Means, MD  etodolac (LODINE) 400 MG tablet Take 1 tablet (400 mg total) by mouth 2 (two) times daily. 10/28/20   Sater, Nanine Means, MD  ibuprofen (ADVIL) 200 MG tablet Take 200 mg by mouth every 6 (six) hours as needed. Takes Motrin brand    [provider]  lisinopril (ZESTRIL) 10 MG tablet Take 10 mg by mouth daily. 04/20/20   [provider]  OVER THE COUNTER MEDICATION Place 1 drop into both eyes in the morning and at bedtime. Medication: Eye Allergy Relief Drops. Pheniramine Maleate 0.315% and Naphazoline Hydrochloride 0.02675% Opth Soln    [provider]    Allergies Erythromycin   REVIEW OF SYSTEMS  Negative except as noted here or in the History of Present Illness.   PHYSICAL EXAMINATION  Initial Vital Signs Blood pressure 134/87, pulse 85, temperature 98.5 F (36.9 C), resp. rate 18, SpO2 100 %.  Examination General: Well-developed, well-nourished male in no acute distress; appearance consistent with age of  record HENT: normocephalic; atraumatic Eyes: Eye shield over left eye Neck: supple Heart: regular rate and rhythm Lungs: clear to auscultation bilaterally Abdomen: soft; bladder nontender; bowel sounds present Rectum: Soft impaction Extremities: No deformity; full range of motion; pulses normal Neurologic: Awake, alert and oriented; motor function intact in all extremities and symmetric; right facial droop Skin: Warm and dry Psychiatric: Normal mood and affect   RESULTS  Summary of this visit's results, reviewed and interpreted by myself:   EKG Interpretation  Date/Time:    Ventricular Rate:    PR Interval:    QRS Duration:   QT Interval:    QTC Calculation:   R Axis:     Text  Interpretation:         Laboratory Studies: No results found for this or any previous visit (from the past 24 hour(s)). Imaging Studies: No results found.  ED COURSE and MDM  Nursing notes, initial and subsequent vitals signs, including pulse oximetry, reviewed and interpreted by myself.  Vitals:   11/06/21 2223 11/06/21 2335  BP: (!) 158/94 134/87  Pulse: 84 85  Resp: 18 18  Temp: 98.5 F (36.9 C)   SpO2: 100% 100%   Medications - No data to display  We will start with a soapsuds enema and attempt to empty his rectum.  This may relieve pressure on his bladder allowing him to void.  If not we will consider placing a Foley catheter.   PROCEDURES  Procedures   ED DIAGNOSES  No diagnosis found.

## 2021-11-06 NOTE — ED Notes (Signed)
Bladder Scan 662 ml

## 2021-11-06 NOTE — ED Triage Notes (Signed)
  C/o constipation, back pain, which is causing urinary frequency and discomfort.   Surgery for detached retina on 8/8,  Bell palsy started 8/1

## 2021-11-07 LAB — URINALYSIS, ROUTINE W REFLEX MICROSCOPIC
Bilirubin Urine: NEGATIVE
Glucose, UA: NEGATIVE mg/dL
Ketones, ur: NEGATIVE mg/dL
Leukocytes,Ua: NEGATIVE
Nitrite: NEGATIVE
Protein, ur: NEGATIVE mg/dL
Specific Gravity, Urine: 1.007 (ref 1.005–1.030)
pH: 5 (ref 5.0–8.0)

## 2021-11-07 MED ORDER — MAGNESIUM CITRATE PO SOLN: 1.0000 | Freq: Once | ORAL | Status: DC

## 2021-11-07 MED ORDER — FINGER COTS MISC: 0 refills | Status: DC

## 2021-11-07 MED ORDER — LIDOCAINE 5 % EX CREA: TOPICAL_CREAM | CUTANEOUS | 0 refills | Status: DC

## 2021-11-07 MED ORDER — LIDOCAINE HCL 4 % EX SOLN
CUTANEOUS | Status: DC | PRN
  Filled 2021-11-07: qty 50

## 2021-11-10 ENCOUNTER — Ambulatory Visit: Payer: BC Managed Care – PPO | Admitting: Neurology

## 2021-11-10 ENCOUNTER — Encounter: Payer: Self-pay | Admitting: Neurology

## 2021-11-10 VITALS — BP 134/74 | HR 77 | Ht 72.0 in | Wt 233.3 lb

## 2021-11-10 DIAGNOSIS — G510 Bell's palsy: Secondary | ICD-10-CM | POA: Insufficient documentation

## 2021-11-10 DIAGNOSIS — H4901 Third [oculomotor] nerve palsy, right eye: Secondary | ICD-10-CM | POA: Diagnosis not present

## 2021-11-10 DIAGNOSIS — E538 Deficiency of other specified B group vitamins: Secondary | ICD-10-CM

## 2021-11-10 DIAGNOSIS — R7612 Nonspecific reaction to cell mediated immunity measurement of gamma interferon antigen response without active tuberculosis: Secondary | ICD-10-CM | POA: Diagnosis not present

## 2021-11-10 MED ORDER — CYANOCOBALAMIN 1000 MCG/ML IJ SOLN: INTRAMUSCULAR | 1 refills | Status: DC

## 2021-11-10 MED ORDER — METHYLPREDNISOLONE 4 MG PO TABS: ORAL_TABLET | ORAL | 0 refills | Status: DC

## 2021-11-10 MED ORDER — VALACYCLOVIR HCL 500 MG PO TABS: 500.0000 mg | ORAL_TABLET | Freq: Two times a day (BID) | ORAL | 11 refills | Status: DC

## 2021-11-10 NOTE — Addendum Note (Signed)
Addended by: Arlice Colt A on: 11/10/2021 12:52 PM   Modules accepted: Level of Service

## 2021-11-10 NOTE — Patient Instructions (Addendum)
Take B12 1 mg (1000 mcg) daily. In 2024, you will need a B12 level drawn to make sure you are absorbing the vitamin   For dry eye: Moisturizing eyedrops several times a day. Lacri-lube ointment at night in right eyd

## 2021-11-10 NOTE — Progress Notes (Addendum)
[facility]  PATIENT: [name] [name] DOB: 04/27/1960  REFERRING DOCTOR OR PCP: [name], MD (PCP); Craig Guess, MD (referring MD) SOURCE: Patient, notes from emergency room visit, laboratory results, imaging reports, MRI images personally reviewed.  _________________________________   HISTORICAL  CHIEF COMPLAINT:  Chief Complaint  Patient presents with   Follow-up    Rm 2 here for consult on bells palsy. Pt reports while vacation he had episode of bells palsy on the right side ( first event). Rx'd prednisone and Valtrex has completed both.     HISTORY OF PRESENT ILLNESS:  Facial weakness on the right started Tuesday October 1.    He saw urgent care an started Valtrex and prednisone that evening.    He felt some tingling in the right face but no change yet in   On August 8, he had surgery on the left eye.    He needs to lay on his right side.  For the next week or 2  I saw him last year for a partial 3rd nerve palsy and previously he had right 6th and left 4th CN palsies.     The diplopia from last year had resolved.  No current diplopia.      Last year, he has had additional blood work and lumbar puncture.  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  ANA was positive in the SSB was positive at 1.6 (it had been 4.97 years ago) SSA was negative.  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  Because of the cranial nerve palsy and the positive quant to [name] TB Gold test, a lumbar puncture was performed.  White blood cells were 2, protein, glucose, HSV 1 PCR, HSV 2 PCR, AFB stain, cytopathology was negative.  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   Initially he had a discomfort near the eye became more painful as the diplopia developed.   He went to the ED 10/24/20.  He had MRI orbits/brain (normal) and labs.  An LP was attempted but not completed.   He received a single IV dose of  steroid.  While in the emergency room, he was also noted to have elevated troponin and was referred to cardiology.  He saw his ophthalmologist  and urgent care who felt he had a sinus infection and started Augmentin.   Besides the diplopia and pain, he also noted reduced appetite and fatigue.  He had a 6th nerve palsy OS March 2015 (saw Dr. Janann Colonel)  and a 4th nerve palsy OS around 2005.   An autoimmune was suspected as the ANA was positive (ENA SSB was 4.9).  HgbA1c was negative.   ESR/CRP were normal.   He saw rheumatology but no firm diagnosis was made.    In 2010 he also had pleural effusion and right hilar lymphadenopathy in his lungs and was prescribed steroids x 2  He had a fourth episode with right 7th nerve palsy October 26, 2021.  He is not diabetic.   He has HTN and hyperlipidemia.   Denies significant dry eyes or dry mouth  Studies: MRI brain and MRI orbits 10/24/2020 were unremarkable.  I personally reviewed the images and concur.  There is no evidence of sinusitis.    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  ANA was positive in the  SSB was positive at 1.6 (it had been 4.97 years ago) SSA was negative.    REVIEW OF SYSTEMS: Constitutional: No fevers, chills, sweats, or change in appetite Eyes: No visual changes, double vision, eye pain Ear, nose and throat: No hearing loss, ear pain, nasal congestion, sore throat Cardiovascular: No chest pain, palpitations Respiratory:  No shortness of breath at rest or with exertion.   No wheezes GastrointestinaI: No nausea, vomiting, diarrhea, abdominal pain, fecal incontinence Genitourinary:  No dysuria, urinary retention or frequency.  No nocturia. Musculoskeletal:  No neck pain, back pain Integumentary: No rash, pruritus, skin lesions Neurological: as above Psychiatric: No  depression at this time.  No anxiety Endocrine: No palpitations, diaphoresis, change in appetite, change in weigh or increased thirst Hematologic/Lymphatic:  No anemia, purpura, petechiae. Allergic/Immunologic: No itchy/runny eyes, nasal congestion, recent allergic reactions, rashes  ALLERGIES: Allergies  Allergen Reactions   Erythromycin     Unknown    HOME MEDICATIONS:  Current Outpatient Medications:    atorvastatin (LIPITOR) 10 MG tablet, Take 10 mg by mouth daily., Disp: , Rfl:    latanoprost (XALATAN) 0.005 % ophthalmic solution, 1 drop at bedtime., Disp: , Rfl:    lisinopril (ZESTRIL) 10 MG tablet, Take 10 mg by mouth daily., Disp: , Rfl:    methylPREDNISolone (MEDROL) 4 MG tablet, Taper from 6 pills po for one day to 1 pill po the last day over 6 days, Disp: 21 tablet, Rfl: 0   ofloxacin (OCUFLOX) 0.3 % ophthalmic solution, Place into the left eye., Disp: , Rfl:    prednisoLONE acetate (PRED FORTE) 1 % ophthalmic suspension, SMARTSIG:In Eye(s), Disp: , Rfl:    tiZANidine (ZANAFLEX) 2 MG tablet, Take 2-4 mg by mouth every 8 (eight) hours as needed., Disp: , Rfl:    valACYclovir (VALTREX) 500 MG tablet, Take 1 tablet (500 mg total) by mouth 2 (two) times daily., Disp: 60 tablet, Rfl: 11   azelastine (OPTIVAR) 0.05 % ophthalmic solution, Apply to eye. (Patient not taking: Reported on 11/10/2021), Disp: , Rfl:    cyanocobalamin (VITAMIN B12) 1000 MCG/ML injection, Inject 1 ml IM every 4 weeks.   Dispense with #13 1 cc syringes and 1 inch 27g needles, Disp: 13 mL, Rfl: 1  PAST MEDICAL HISTORY: Past Medical History:  Diagnosis Date   Hypertension     PAST SURGICAL HISTORY: History reviewed. No pertinent surgical history.   FAMILY HISTORY: Family History  Problem Relation Age of Onset   Stroke Father    High Cholesterol Father    Diabetes Father    Hypertension Maternal Grandmother    Stroke Paternal Grandfather     SOCIAL HISTORY:  Social History   Socioeconomic  History   Marital status: Married    Spouse name: [name]   Number of children: 2   Years of education: Master's   Highest education level: Not on file  Occupational History    Employer: [employer]  Tobacco Use   Smoking status: Former    Types: Cigarettes   Smokeless tobacco: Never  Vaping Use   Vaping Use: Never used  Substance and Sexual Activity   Alcohol use: No   Drug use: No   Sexual activity: Not on file  Other Topics Concern   Not on file  Social History Narrative   Patient is married to [name]), has 2 childrenPatient is right handedEducation level is Master's degreeCaffeine consumption is 2-4 cups daily   Lives with wife   Right handed   Caffeine: 1-2 coke  a day   Social Determinants of Radio broadcast assistant Strain: Not on file  Food Insecurity: Not on file  Transportation Needs: Not on file  Physical Activity: Not on file  Stress: Not on file  Social Connections: Not on file  Intimate Partner Violence: Not on file     PHYSICAL EXAM  Vitals:   11/10/21 0815  BP: 134/74  Pulse: 77  Weight: 233 lb 5 oz (105.8 kg)  Height: 6' (1.829 m)    Body mass index is 31.64 kg/m.   General: The patient is well-developed and well-nourished and in no acute distress  HEENT:  Head is [place]/AT.  Sclera are anicteric.    Neck:  .  The neck is nontender.   Skin: Extremities are without rash or  edema.  Musculoskeletal:  Back is nontender  Neurologic Exam  Mental status: The patient is alert and oriented x 3 at the time of the examination. The patient has apparent normal recent and remote memory, with an apparently normal attention span and concentration ability.   Speech is normal.  Cranial nerves:    Extraocular muscles are likely normal.  Due to his recent surgery on the left, he has pain looking up.  I did not note any disconjugate gaze.  He has a complete 7th nerve palsy on the right   pupils are equal, round, and reactive to light and  accomodation.   Trapezius and sternocleidomastoid strength is normal. No dysarthria is noted.  The tongue is midline, and the patient has symmetric elevation of the soft palate. No obvious hearing deficits are noted.  Motor:  Muscle bulk is normal.   Tone is normal. Strength is  5 / 5 in all 4 extremities.   Sensory: He reports reduced sensation to temperature in the right arm and leg but symmetric vibration sensation.  Coordination: Cerebellar testing reveals good finger-nose-finger and heel-to-shin bilaterally.  Gait and station: Station is normal.   Gait is normal. Tandem gait is slightly wide. Romberg is negative.   Reflexes: Deep tendon reflexes are symmetric and normal bilaterally.       DIAGNOSTIC DATA (LABS, IMAGING, TESTING) - I reviewed patient records, labs, notes, testing and imaging myself where available.  Lab Results  Component Value Date   WBC 7.9 10/24/2020   HGB 14.9 10/24/2020   HCT 43.4 10/24/2020   MCV 88.8 10/24/2020   PLT 321 10/24/2020      Component Value Date/Time   NA 136 10/24/2020 1519   K 3.5 10/24/2020 1519   CL 104 10/24/2020 1519   CO2 23 10/24/2020 1519   GLUCOSE 114 (H) 10/24/2020 1519   BUN 8 10/24/2020 1519   CREATININE 1.02 10/24/2020 1519   CALCIUM 9.0 10/24/2020 1519   PROT 6.4 (L) 10/24/2020 1519   ALBUMIN 3.7 10/24/2020 1519   AST 22 10/24/2020 1519   ALT 23 10/24/2020 1519   ALKPHOS 105 10/24/2020 1519   BILITOT 0.8 10/24/2020 1519   GFRNONAA >60 10/24/2020 1519   GFRAA  10/21/2008 1000    >60        The eGFR has been calculated using the MDRD equation. This calculation has not been validated in all clinical situations. eGFR's persistently <60 mL/min signify possible Chronic Kidney Disease.   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   this is his 4th cranial nerve palsy.  It is possible that this could be due to HSV or VZV viruses.  He did take 1 g Valtrex 3 times a day along with prednisone starting within a day of symptoms.  Since this is rare, the best long-term treatment is uncertain.  Due to multiple events in a short period of time, I will add Valtrex prophylaxis 500 mg p.o. twice daily.  I also gave her a prescription for steroid pack to start taking immediately if he has a new cranial nerve palsy.   He has B12 deficiency that appears to be due to the presence of anti-intrinsic factor antibodies.  He has been started on B12 shots.  He should continue these.   We will call him to have him continue these shots rather than switch over to pills We discussed short and long-term prognosis for Bell's palsy. He will return in 1 year or sooner if there are new or worsening neurologic symptoms.   40-minute office visit with the majority of the time spent face-to-face for history and physical, discussion/counseling and decision-making.  Additional time with record review and documentation.   Zacariah Belue A. Felecia Shelling, MD, [facility] 0/76/2263, 33:54 PM Certified in Neurology, Clinical Neurophysiology, Sleep Medicine and Neuroimaging  [facility] [address], [place], [place] [address] [phone]

## 2021-11-11 ENCOUNTER — Telehealth: Payer: Self-pay | Admitting: Neurology

## 2021-11-11 NOTE — Telephone Encounter (Signed)
-----   Message from Britt Bottom, MD sent at 11/10/2021  9:49 AM EDT ----- Regarding: B12 Please let him know, when I reviewed his chart, I see that the B12 deficiency is caused by antibodies that block absorption --- therefore he needs to continue monthly B12 shots (instead of pills) and I sent the prescription in

## 2021-11-11 NOTE — Telephone Encounter (Signed)
Called the pt. There was no answer. Left a detailed message advising that Dr Felecia Shelling recommends the patient remain on b 12 injections and has called that into the pharmacy on file. Advised if there were any questions the patient can call us back.

## 2021-12-20 ENCOUNTER — Institutional Professional Consult (permissible substitution): Payer: BC Managed Care – PPO | Admitting: Neurology

## 2022-01-03 ENCOUNTER — Ambulatory Visit
Admission: RE | Admit: 2022-01-03 | Discharge: 2022-01-03 | Disposition: A | Discharge status: Home / Self Care | Payer: BC Managed Care – PPO | Source: Ambulatory Visit | Attending: Family Medicine | Admitting: Family Medicine

## 2022-01-03 ENCOUNTER — Other Ambulatory Visit: Payer: Self-pay | Admitting: Family Medicine

## 2022-01-03 DIAGNOSIS — R3129 Other microscopic hematuria: Secondary | ICD-10-CM

## 2022-01-03 DIAGNOSIS — M545 Low back pain, unspecified: Secondary | ICD-10-CM

## 2022-05-11 ENCOUNTER — Other Ambulatory Visit: Payer: Self-pay

## 2022-08-25 IMAGING — CT CT HEAD W/O CM
4 series · 16 of 47 positions shown, 18 images · non-contrast
Comparison: Brain MRI dated 06/01/2013.

CLINICAL DATA: Headache. Right mandibular pain radiating behind the
right eye. No reported injury.

EXAM:
CT HEAD WITHOUT CONTRAST
CT MAXILLOFACIAL WITHOUT CONTRAST
TECHNIQUE: Multidetector CT imaging of the head and maxillofacial structures
were performed using the standard protocol without intravenous
contrast. Multiplanar CT image reconstructions of the maxillofacial
structures were also generated.

[Series 2: head wo · axial · 0.48mm/px · z∈[-311,-191]mm · 7 of 34 slices shown, 9 images]
[im 5/34  brain]
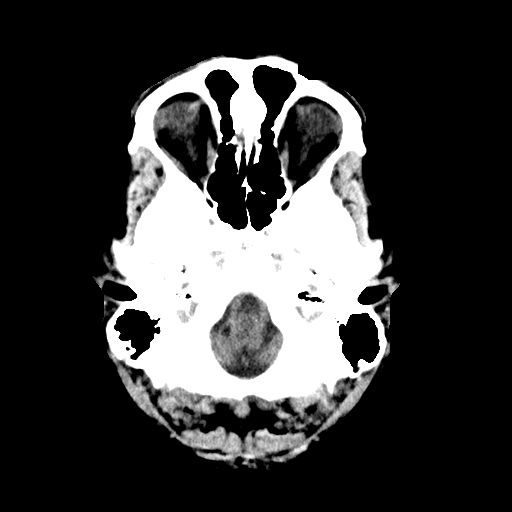
[im 5/34  bone]
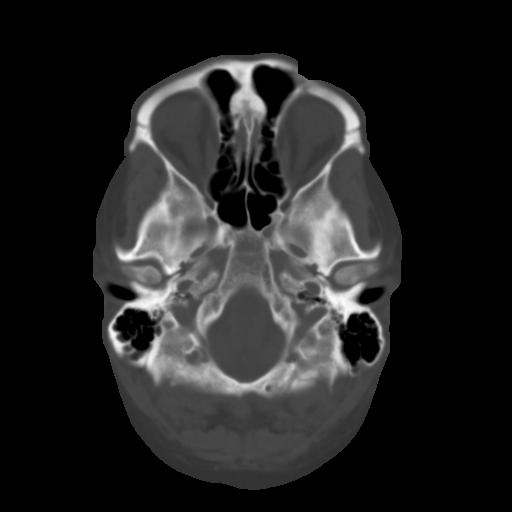
[im 9/34  brain]
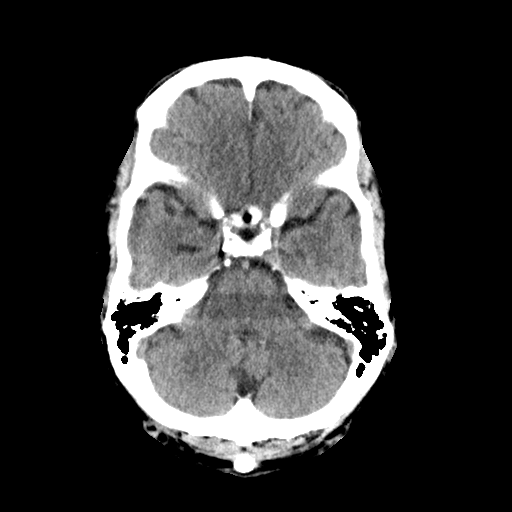
[im 13/34  brain]
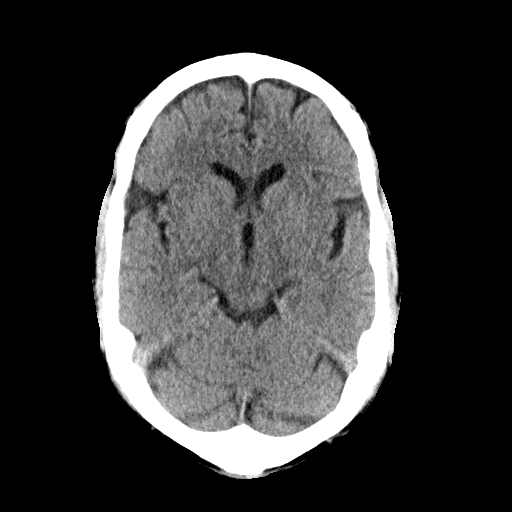
[im 17/34  brain]
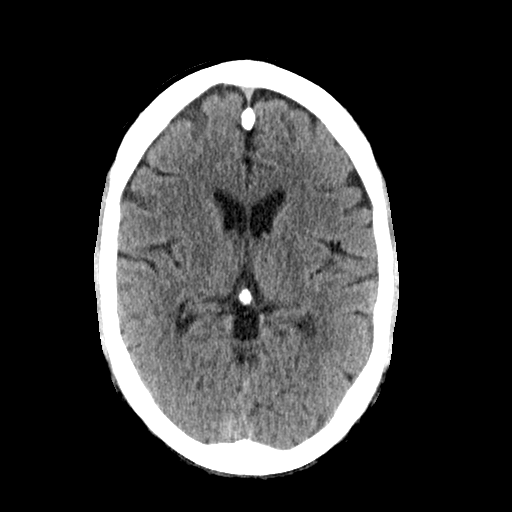
[im 21/34  brain]
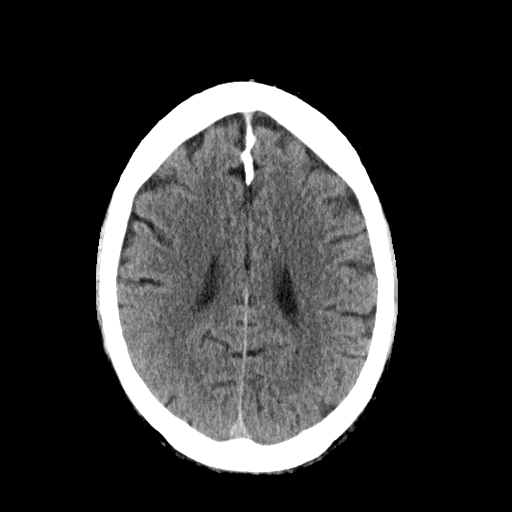
[im 21/34  bone]
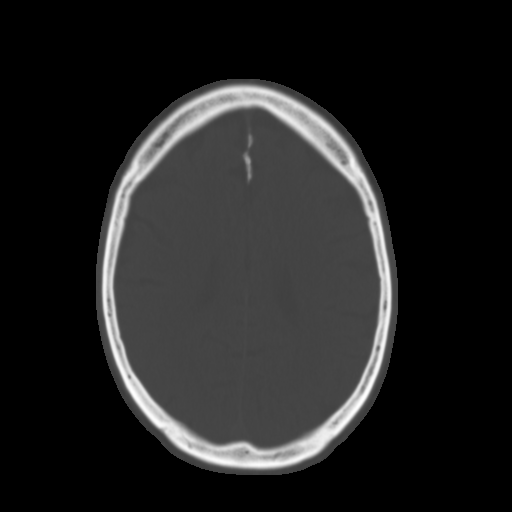
[im 25/34  brain]
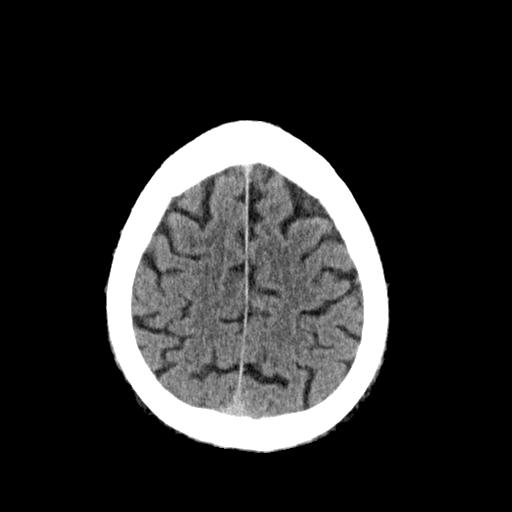
[im 29/34  brain]
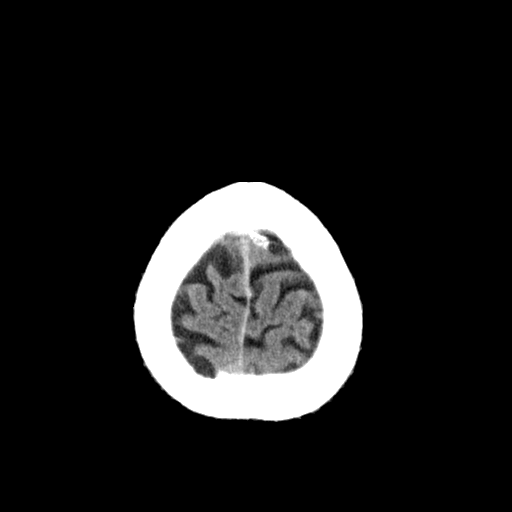

[Series 3: head bone · axial · 0.48mm/px · z∈[-315,-281]mm · 3 of 85 slices shown]
[im 9/85  bone]
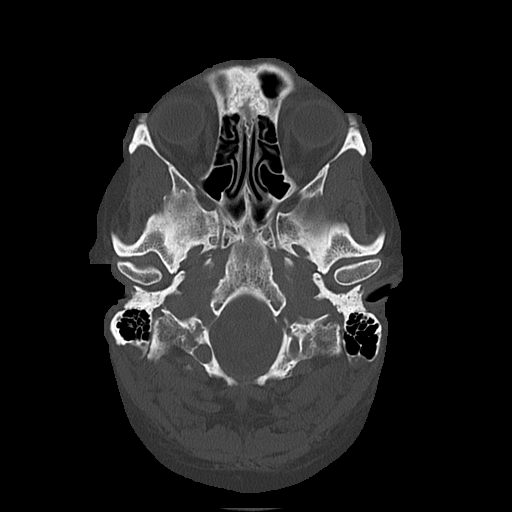
[im 17/85  bone]
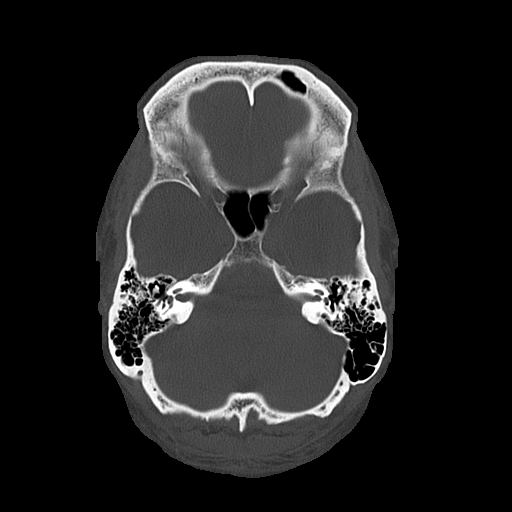
[im 26/85  bone]
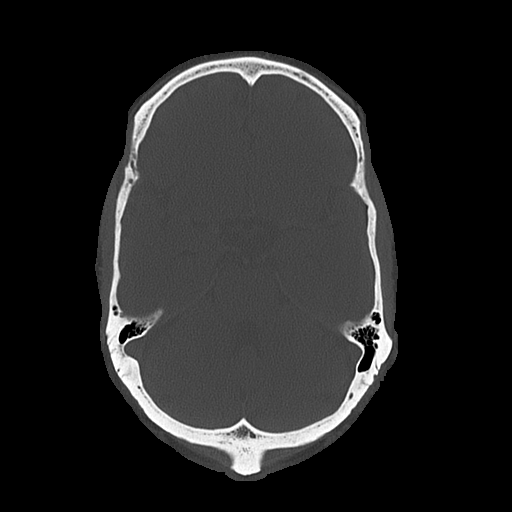

[Series 4: coronal soft · coronal · 0.33mm/px · 3 of 74 slices shown]
[im 25/74  brain]
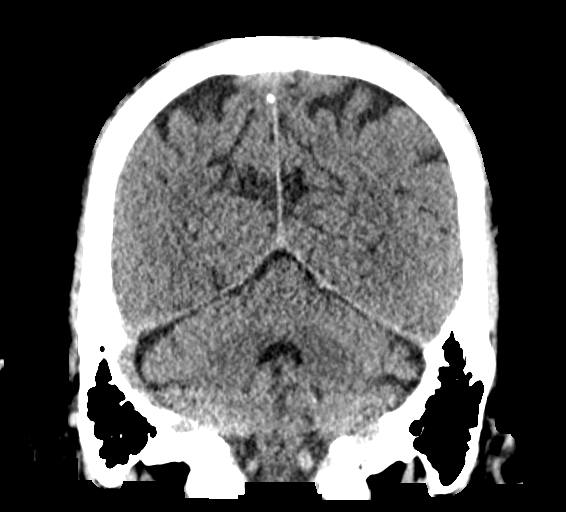
[im 33/74  brain]
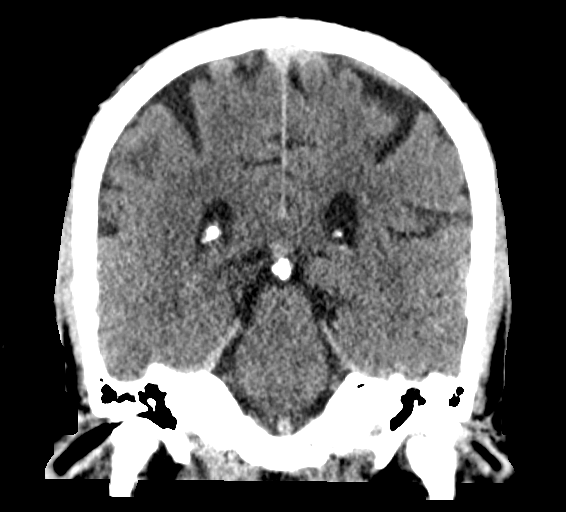
[im 41/74  brain]
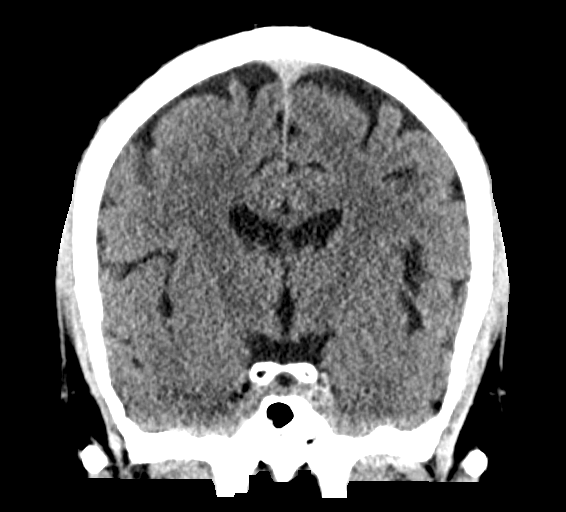

[Series 5: sagittal soft · sagittal · 0.33mm/px · 3 of 60 slices shown]
[im 20/60  brain]
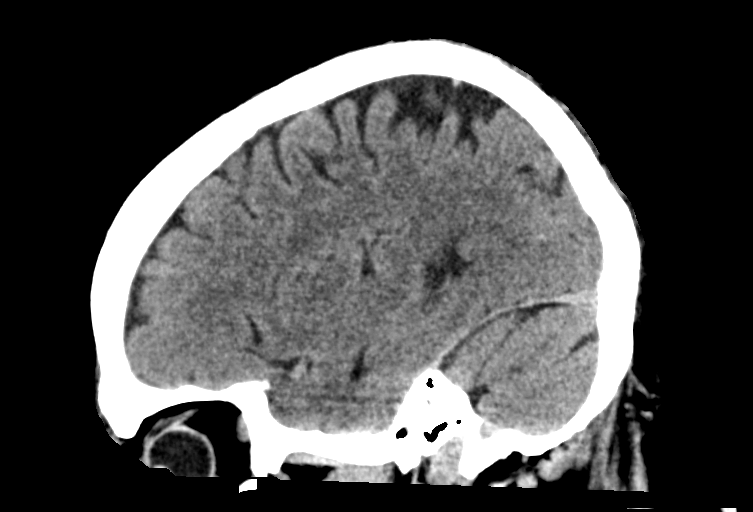
[im 30/60  brain]
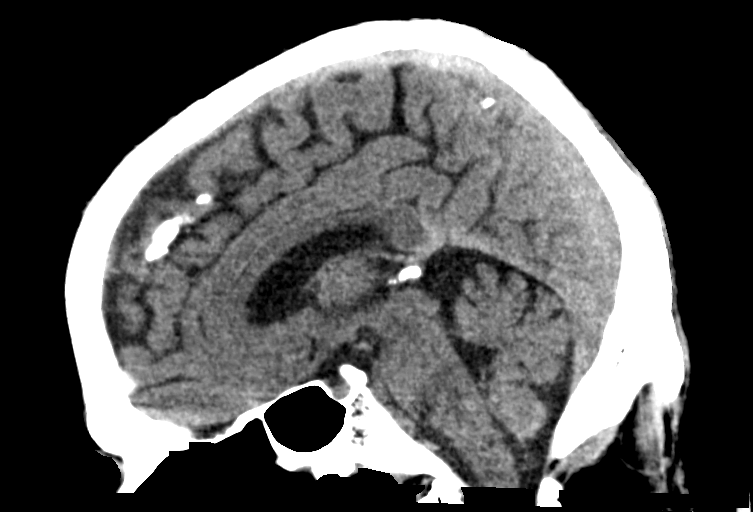
[im 40/60  brain]
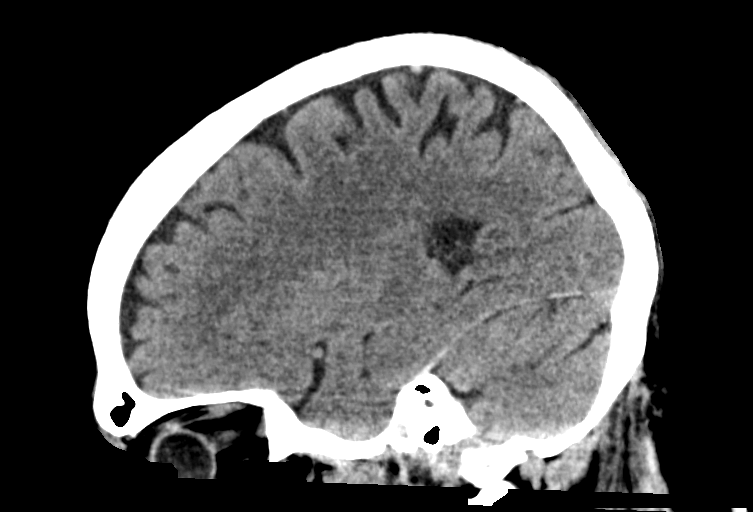

[16 of 47 positions shown; findings below may reference images not displayed]

FINDINGS: CT HEAD FINDINGS

Brain: Minimally enlarged ventricles and subarachnoid spaces. No
intracranial hemorrhage, mass lesion or CT evidence of acute
infarction.

Vascular: No hyperdense vessel or unexpected calcification.

Skull: Normal. Negative for fracture or focal lesion.

Other: None.

CT MAXILLOFACIAL FINDINGS

Osseous: No fracture or mandibular dislocation. No destructive
process. No visible dental caries or periapical abscesses.

Orbits: Negative. No traumatic or inflammatory finding.

Sinuses: Bilateral maxillary sinus retention cysts. No evidence of
sinusitis.

Soft tissues: Unremarkable.
IMPRESSION: 1. No acute abnormality.
2. Minimal diffuse cerebral and cerebellar atrophy

## 2022-11-01 ENCOUNTER — Other Ambulatory Visit: Payer: Self-pay | Admitting: Neurology

## 2022-11-08 ENCOUNTER — Ambulatory Visit: Payer: BC Managed Care – PPO | Admitting: Neurology

## 2022-11-08 ENCOUNTER — Encounter: Payer: Self-pay | Admitting: Neurology

## 2022-11-08 VITALS — BP 129/78 | HR 62 | Ht 72.0 in | Wt 230.0 lb

## 2022-11-08 DIAGNOSIS — H4901 Third [oculomotor] nerve palsy, right eye: Secondary | ICD-10-CM | POA: Diagnosis not present

## 2022-11-08 DIAGNOSIS — E538 Deficiency of other specified B group vitamins: Secondary | ICD-10-CM

## 2022-11-08 DIAGNOSIS — G510 Bell's palsy: Secondary | ICD-10-CM

## 2022-11-08 MED ORDER — CYANOCOBALAMIN 1000 MCG/ML IJ SOLN: INTRAMUSCULAR | 1 refills | Status: DC

## 2022-11-08 MED ORDER — VALACYCLOVIR HCL 500 MG PO TABS: 500.0000 mg | ORAL_TABLET | Freq: Two times a day (BID) | ORAL | 12 refills | Status: DC

## 2022-11-08 NOTE — Progress Notes (Signed)
[facility]  PATIENT: [name] [name] DOB: Aug 07, 1960  REFERRING DOCTOR OR PCP: Blair Heys, MD (PCP); Lora Havens, MD (referring MD) SOURCE: Patient, notes from emergency room visit, laboratory results, imaging reports, MRI images personally reviewed.  _________________________________   HISTORICAL  CHIEF COMPLAINT:  Chief Complaint  Patient presents with   Follow-up    Pt in room 11, wife in room. Here for right-sided Bell's palsy follow up. Pt said still has palsy on right side of face.     HISTORY OF PRESENT ILLNESS:  Facial weakness on the right started Tuesday October 1.    He saw urgent care an started Valtrex and prednisone that evening.    He felt some tingling in the right face but no change yet in   On August 8, he had surgery on the left eye.    He needs to lay on his right side.  For the next week or 2  I saw him last year for a partial 3rd nerve palsy and previously he had right 6th and left 4th CN palsies.     The diplopia from last year had resolved.  No current diplopia.      He has some tingling in fingers of the right hand.  This seems to happen mostly when he is sleeping and sometimes he wakes up with painful tingling in the right hand.  He denies any weakness  Last year, he has had additional blood work and lumbar puncture.  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  ANA was positive in the SSB was positive at 1.6 (it had been 4.97 years ago) SSA was negative.  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  Because of the cranial nerve palsy and the positive quantiferon TB Gold test, a lumbar puncture was performed.  White blood cells were 2, protein, glucose, HSV 1 PCR, HSV 2 PCR, AFB stain, cytopathology was negative.  He is not diabetic.   He has HTN and hyperlipidemia.   Denies significant dry eyes or dry mouth  He takes a B12 shot monthly.     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  He also required cataract  surgery on the 1 side.  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   Initially he had a discomfort near the eye became more painful as the diplopia developed.   He went to the ED 10/24/20.  He had MRI orbits/brain (normal) and labs.  An LP was attempted but not completed.   He received a single IV dose of steroid.  While in the emergency room, he was also noted to have elevated troponin and was referred to cardiology.  He saw his ophthalmologist  and urgent care who felt he had a sinus infection and started Augmentin.   Besides the diplopia and pain, he also noted reduced appetite and fatigue.  He had a 6th nerve palsy OS March 2015 (saw Dr. Hosie Poisson)  and a 4th nerve palsy OS around 2005.   An autoimmune was suspected as the ANA was positive (ENA SSB was 4.9).  HgbA1c was negative.   ESR/CRP were normal.   He saw rheumatology but no firm diagnosis was made.    In 2010 he also had pleural effusion and right hilar lymphadenopathy in his lungs and was prescribed steroids x 2  He had a fourth episode with right 7th nerve palsy October 26, 2021.  Studies: MRI brain and MRI orbits 10/24/2020 were unremarkable.  I personally reviewed the images and concur.  There is no evidence of sinusitis.    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  ANA was positive in the SSB was positive at 1.6 (it had been 4.97 years ago) SSA was negative.    REVIEW OF SYSTEMS: Constitutional: No fevers, chills, sweats, or change in appetite Eyes: No visual changes, double vision, eye pain Ear, nose and throat: No hearing loss, ear pain, nasal congestion, sore throat Cardiovascular: No chest pain, palpitations Respiratory:  No shortness of breath at rest or with  exertion.   No wheezes GastrointestinaI: No nausea, vomiting, diarrhea, abdominal pain, fecal incontinence Genitourinary:  No dysuria, urinary retention or frequency.  No nocturia. Musculoskeletal:  No neck pain, back pain Integumentary: No rash, pruritus, skin lesions Neurological: as above Psychiatric: No depression at this time.  No anxiety Endocrine: No palpitations, diaphoresis, change in appetite, change in weigh or increased thirst Hematologic/Lymphatic:  No anemia, purpura, petechiae. Allergic/Immunologic: No itchy/runny eyes, nasal congestion, recent allergic reactions, rashes  ALLERGIES: Allergies  Allergen Reactions   Erythromycin     Unknown    HOME MEDICATIONS:  Current Outpatient Medications:    acetaminophen (TYLENOL) 500 MG tablet, Take 500 mg by mouth every 6 (six) hours as needed., Disp: , Rfl:    atorvastatin (LIPITOR) 10 MG tablet, Take 10 mg by mouth daily., Disp: , Rfl:    lisinopril (ZESTRIL) 10 MG tablet, Take 10 mg by mouth daily., Disp: , Rfl:    potassium chloride SA (KLOR-CON M15) 15 MEQ tablet, Take 15 mEq by mouth 2 (two) times daily., Disp: , Rfl:    sildenafil (VIAGRA) 100 MG tablet, Take 100 mg by mouth as needed. 1/2 tablet as needed, Disp: , Rfl:    azelastine (OPTIVAR) 0.05 % ophthalmic solution, Apply to eye. (Patient not taking: Reported on 11/10/2021), Disp: , Rfl:    cyanocobalamin (VITAMIN B12) 1000 MCG/ML injection, Inject 1 ml IM every 4 weeks.   Dispense with #13 1 cc syringes and 1 inch 25g needles, Disp: 13 mL, Rfl: 1   latanoprost (XALATAN) 0.005 % ophthalmic solution, 1 drop at bedtime. (Patient not taking: Reported on 11/08/2022), Disp: , Rfl:    methylPREDNISolone (MEDROL) 4 MG tablet, Taper from 6 pills po for one day to 1 pill po the last day over 6 days (Patient not taking: Reported on 11/08/2022), Disp: 21 tablet, Rfl: 0   ofloxacin (OCUFLOX) 0.3 % ophthalmic solution, Place into the left eye. (Patient not taking: Reported on  11/08/2022), Disp: , Rfl:    prednisoLONE acetate (PRED FORTE) 1 % ophthalmic suspension, SMARTSIG:In Eye(s) (Patient not taking: Reported on 11/08/2022), Disp: , Rfl:    tiZANidine (ZANAFLEX) 2 MG tablet, Take 2-4 mg by mouth every 8 (eight) hours as needed. (Patient not taking: Reported on 11/08/2022), Disp: , Rfl:    valACYclovir (VALTREX) 500 MG tablet, Take 1 tablet (500 mg total) by mouth 2 (two) times daily., Disp: 60 tablet, Rfl: 12  PAST MEDICAL HISTORY: Past Medical History:  Diagnosis Date   Hypertension     PAST SURGICAL HISTORY: No past surgical history on file.   FAMILY HISTORY: Family History  Problem Relation Age of Onset   Stroke Father    High Cholesterol Father    Diabetes Father    Hypertension Maternal Grandmother  Stroke Paternal Grandfather     SOCIAL HISTORY:  Social History   Socioeconomic History   Marital status: Married    Spouse name: Lurena Joiner   Number of children: 2   Years of education: Master's   Highest education level: Not on file  Occupational History    Employer: [employer]  Tobacco Use   Smoking status: Former    Types: Cigarettes   Smokeless tobacco: Never  Vaping Use   Vaping status: Never Used  Substance and Sexual Activity   Alcohol use: No   Drug use: No   Sexual activity: Not on file  Other Topics Concern   Not on file  Social History Narrative   Patient is married to [place]), has 2 childrenPatient is right handedEducation 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 2-4 cups daily   Lives with wife   Right handed   Caffeine: 1-2 coke a day   Social Determinants of Health   Financial Resource Strain: Not on file  Food Insecurity: Not on file  Transportation Needs: Not on file  Physical Activity: Not on file  Stress: Not on file  Social Connections: Unknown (08/09/2021)   Received from [facility]   Social Network    Social Network: Not on file  Intimate Partner Violence:  Unknown (07/01/2021)   Received from [facility], [facility]   HITS    Physically Hurt: Not on file    Insult or Talk Down To: Not on file    Threaten Physical Harm: Not on file    Scream or Curse: Not on file     PHYSICAL EXAM  Vitals:   11/08/22 1550  BP: 129/78  Pulse: 62  Weight: 230 lb (104.3 kg)  Height: 6' (1.829 m)    Body mass index is 31.19 kg/m.   General: The patient is well-developed and well-nourished and in no acute distress  HEENT:  Head is [place]/AT.  Sclera are anicteric.    Neck:  .  The neck is nontender.   Skin: Extremities are without rash or  edema.  Musculoskeletal:  Back is nontender  Neurologic Exam  Mental status: The patient is alert and oriented x 3 at the time of the examination. The patient has apparent normal recent and remote memory, with an apparently normal attention span and concentration ability.   Speech is normal.  Cranial nerves:    Extraocular muscles are normal.  He has a partial 7th nerve palsy on the right.  There is coarse reinnervation blinking on the right when he smiles and having the corner of his mouth rise when he blinks.  Trapezius and sternocleidomastoid strength is normal. No dysarthria is noted.  The tongue is midline, and the patient has symmetric elevation of the soft palate. No obvious hearing deficits are noted.  Motor:  Muscle bulk is normal.   Tone is normal. Strength is  5 / 5 in all 4 extremities.   Sensory: He reports reduced sensation to temperature in the right arm and leg but symmetric vibration sensation.  Coordination: Cerebellar testing reveals good finger-nose-finger and heel-to-shin bilaterally.  Gait and station: Station is normal.   Gait is normal. Tandem gait is slightly wide. Romberg is negative.   Reflexes: Deep tendon reflexes are symmetric and normal bilaterally.       DIAGNOSTIC DATA (LABS, IMAGING, TESTING) - I reviewed patient records, labs, notes, testing and imaging myself where  available.  Lab Results  Component Value Date   WBC 7.9  10/24/2020   HGB 14.9 10/24/2020   HCT 43.4 10/24/2020   MCV 88.8 10/24/2020   PLT 321 10/24/2020      Component Value Date/Time   NA 136 10/24/2020 1519   K 3.5 10/24/2020 1519   CL 104 10/24/2020 1519   CO2 23 10/24/2020 1519   GLUCOSE 114 (H) 10/24/2020 1519   BUN 8 10/24/2020 1519   CREATININE 1.02 10/24/2020 1519   CALCIUM 9.0 10/24/2020 1519   PROT 6.4 (L) 10/24/2020 1519   ALBUMIN 3.7 10/24/2020 1519   AST 22 10/24/2020 1519   ALT 23 10/24/2020 1519   ALKPHOS 105 10/24/2020 1519   BILITOT 0.8 10/24/2020 1519   GFRNONAA >60 10/24/2020 1519   GFRAA  10/21/2008 1000    >60        The eGFR has been calculated using the MDRD equation. This calculation has not been validated in all clinical situations. eGFR's persistently <60 mL/min signify possible Chronic Kidney Disease.   No results found for: "CHOL", "HDL", "LDLCALC", "LDLDIRECT", "TRIG", "CHOLHDL"  Lab Results  Component Value Date   VITAMINB12 127 (L) 10/24/2020   Lab Results  Component Value Date   TSH 1.240 10/28/2020       ASSESSMENT AND PLAN  Right-sided Bell's palsy  Right oculomotor nerve palsy  B12 deficiency  He has had multiple cranial nerve (involving a 3rd, 4th, 6th and 7th nerve at different times) most recently he had the Bell's palsy on the right.  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     Since this is rare, the best long-term treatment is uncertain.  He will continue Valtrex prophylaxis 500 mg p.o. twice daily and take a steroid pack if a new episode of cranial nerve palsy occurs.   He has B12 deficiency that appears to be due to the presence of anti-intrinsic factor antibodies.  He has been started on B12 shots.  He should continue these.   We will call him to have him continue these shots rather than switch over to pills Unfortunately, his only had a partial recovery from the Bell's palsy and also  has crossed reinnervation (motor and gland) He will return in 1 year or sooner if there are new or worsening neurologic symptoms.   Mahki Spikes A. Epimenio Foot, MD, [facility] 11/08/2022, 5:08 PM Certified in Neurology, Clinical Neurophysiology, Sleep Medicine and Neuroimaging  [facility] [address] [place], [place] [address] [phone]

## 2022-12-30 ENCOUNTER — Ambulatory Visit: Payer: BC Managed Care – PPO | Admitting: Cardiology

## 2022-12-30 NOTE — Progress Notes (Addendum)
Cardiology Office Note:  .   Date:  12/30/2022  ID:  [name] [name], DOB March 02, 1961, MRN [id number] PCP: Blair Heys, MD  [facility] Providers Cardiologist:  Truett Mainland, MD PCP: Blair Heys, MD  Chief Complaint  Patient presents with   Hypertension      History of Present Illness: .    [name] [name] is a 62 y.o. male with hypertension, hyperlipidemia, OSA, idiopathic recurrent polyneuritis cranialis  The patient denies experiencing chest pain but reports feeling out of shape, particularly when trying to increase their walking pace.  Patient always has had profuse sweating, especially during high humidity.  He reports no changes in their symptoms over the past two years. Blood pressure is well controlled, and they are currently on lisinopril and atorvastatin.  Patient was hospitalized in 09/2018 with complaints of diplopia, right eye pain, headache.  During the evaluation, he was found to have mildly elevated and flat troponin at 61, 62.  Echocardiogram mild LVH, normal LVEF, mild MR and mild PI.  He was supposed to undergo stress testing at some point for ischemic evaluation given his mildly elevated troponin and risk factors.  However, patient did not was not keen on proceeding with this testing at that time.  He is not referred back to me by his PCP Dr. Manus Gunning due to profuse sweating.   On a separate note, his neurological condition is followed by Dr. Epimenio Foot at [facility], and is thought to be idiopathic recurrent polyneuritis cranialis.  According to Dr. Epimenio Foot, this condition is rare and the best long-term treatment is on sertraline.  As per his last note, patient will continue Valtrex prophylaxis 5 mg p.o. twice daily, and take steroid pack if new episode of cranial hypoxia occurs.  In addition, he is also being treated for B12 deficiency.  Vitals:   01/02/23 0817  BP: 126/80  Pulse: 77  Resp: 16      ROS:  Review of Systems  Cardiovascular:  Negative  for chest pain, dyspnea on exertion, leg swelling, palpitations and syncope.     Studies Reviewed: [name]       EKG 04/18/2023: Sinus rhythm with occasional Premature ventricular complexes When compared with ECG of 24-Oct-2020 22:08,  PVC is new       04/20/2022: Glucose NA, BUN/Cr 14/1.1. EGFR 74. K 4.4 Hb 14.2 Chol 153, TG 76, HDL 47, LDL 91 TSH 1.5 normal   Echocardiogram 10/28/2020: Left ventricle cavity is normal in size. Mild concentric hypertrophy of the left ventricle. Normal global wall motion. Normal LV systolic function with EF 65%. Normal diastolic filling pattern. Mild (Grade I) mitral regurgitation. Mild pulmonic regurgitation. No evidence of pulmonary hypertension.   Physical Exam:   Physical Exam Vitals and nursing note reviewed.  Constitutional:      General: He is not in acute distress. Neck:     Vascular: No JVD.  Cardiovascular:     Rate and Rhythm: Normal rate and regular rhythm.     Heart sounds: Normal heart sounds. No murmur heard. Pulmonary:     Effort: Pulmonary effort is normal.     Breath sounds: Normal breath sounds. No wheezing or rales.      VISIT DIAGNOSES:   ICD-10-CM   1. Decreased exercise tolerance  R68.89     2. Essential hypertension  I10 EKG 12-Lead       ASSESSMENT AND PLAN: .    [name] is a 62 y.o. male with hypertension, hyperlipidemia, OSA, idiopathic recurrent polyneuritis cranialis  Decreased exercise tolerance: No chest pain, mild dyspnea symptoms. Profuse sweating has been baseline for patient, and not specifically due to cardiac etiology. Nonspecific troponin elevation 2 years ago during a noncardiac admission. Overall suspicion for CAD is low. Recommend exercise stress test and calcium score scan for risk stratification.   Hypertension: Controlled  Hyperlipidemia: Currently on atorvastatin, LDL 91. Continue the same for now, unless significantly elevated calcium score noted.   No orders of the  defined types were placed in this encounter.    F/u as needed  Signed, Elder Negus, MD

## 2023-01-02 ENCOUNTER — Ambulatory Visit: Payer: BC Managed Care – PPO | Attending: Cardiology | Admitting: Cardiology

## 2023-01-02 ENCOUNTER — Encounter: Payer: Self-pay | Admitting: Cardiology

## 2023-01-02 VITALS — BP 126/80 | HR 77 | Resp 16 | Ht 72.0 in | Wt 220.4 lb

## 2023-01-02 DIAGNOSIS — I10 Essential (primary) hypertension: Secondary | ICD-10-CM

## 2023-01-02 DIAGNOSIS — R6889 Other general symptoms and signs: Secondary | ICD-10-CM | POA: Insufficient documentation

## 2023-01-02 NOTE — Patient Instructions (Addendum)
Medication Instructions:  No changes *If you need a refill on your cardiac medications before your next appointment, please call your pharmacy*   Lab Work: No labs ordered If you have labs (blood work) drawn today and your tests are completely normal, you will receive your results only by: MyChart Message (if you have MyChart) OR A paper copy in the mail If you have any lab test that is abnormal or we need to change your treatment, we will call you to review the results.   Testing/Procedures: Your physician has requested that you have an exercise stress myoview. For further information please visit [email or web address] Please follow instruction sheet, as given.   The test will take approximately 3 to 4 hours to complete; you may bring reading material.  If someone comes with you to your appointment, they will need to remain in the main lobby due to limited space in the testing area. **If you are pregnant or breastfeeding, please notify the nuclear lab prior to your appointment**  How to prepare for your Myocardial Perfusion Test: Do not eat or drink 3 hours prior to your test, except you may have water. Do not consume products containing caffeine (regular or decaffeinated) 12 hours prior to your test. (ex: coffee, chocolate, sodas, tea). Do bring a list of your current medications with you.  If not listed below, you may take your medications as normal. Do wear comfortable clothes (no dresses or overalls) and walking shoes, tennis shoes preferred (No heels or open toe shoes are allowed). Do NOT wear cologne, perfume, aftershave, or lotions (deodorant is allowed). If these instructions are not followed your test will have to be rescheduled.     Follow-Up: At [facility], you and your health needs are our priority.  As part of our continuing mission to provide you with exceptional heart care, we have created designated Provider Care Teams.  These Care Teams include your primary  Cardiologist (physician) and Advanced Practice Providers (APPs -  Physician Assistants and Nurse Practitioners) who all work together to provide you with the care you need, when you need it.  We recommend signing up for the patient portal called "MyChart".  Sign up information is provided on this After Visit Summary.  MyChart is used to connect with patients for Virtual Visits (Telemedicine).  Patients are able to view lab/test results, encounter notes, upcoming appointments, etc.  Non-urgent messages can be sent to your provider as well.   To learn more about what you can do with MyChart, go to [email or web address].    Your next appointment:   As needed   Provider:   [facility]

## 2023-02-20 ENCOUNTER — Ambulatory Visit (HOSPITAL_BASED_OUTPATIENT_CLINIC_OR_DEPARTMENT_OTHER): Payer: BC Managed Care – PPO

## 2023-02-20 ENCOUNTER — Encounter (HOSPITAL_COMMUNITY): Payer: BC Managed Care – PPO

## 2023-03-17 ENCOUNTER — Telehealth (HOSPITAL_COMMUNITY): Payer: Self-pay | Admitting: *Deleted

## 2023-03-17 NOTE — Telephone Encounter (Signed)
Patient given detailed instructions per Myocardial Perfusion Study Information Sheet for the test on 03/20/2023 at 8:00. Patient notified to arrive 15 minutes early and that it is imperative to arrive on time for appointment to keep from having the test rescheduled.  If you need to cancel or reschedule your appointment, please call the office within 24 hours of your appointment. . Patient verbalized understanding.[name] [name]

## 2023-03-20 ENCOUNTER — Ambulatory Visit (HOSPITAL_COMMUNITY): Payer: BC Managed Care – PPO | Attending: Cardiology

## 2023-03-20 DIAGNOSIS — R6889 Other general symptoms and signs: Secondary | ICD-10-CM

## 2023-03-20 DIAGNOSIS — I10 Essential (primary) hypertension: Secondary | ICD-10-CM | POA: Diagnosis present

## 2023-03-20 LAB — MYOCARDIAL PERFUSION IMAGING
Base ST Depression (mm): 0 mm
Estimated workload: 9
Exercise duration (min): 7 min
Exercise duration (sec): 21 s
LV dias vol: 111 mL (ref 62–150)
LV sys vol: 48 mL
MPHR: 159 {beats}/min
Nuc Stress EF: 57 %
Peak HR: 142 {beats}/min
Percent HR: 89 %
Rest HR: 64 {beats}/min
Rest Nuclear Isotope Dose: 10.8 mCi
SDS: 0
SRS: 0
SSS: 0
ST Depression (mm): 0 mm
Stress Nuclear Isotope Dose: 31.6 mCi
TID: 0.98

## 2023-03-20 MED ORDER — TECHNETIUM TC 99M TETROFOSMIN IV KIT
10.8000 | PACK | Freq: Once | INTRAVENOUS | Status: AC | PRN
  Administered 2023-03-20: 10.8 via INTRAVENOUS

## 2023-03-20 MED ORDER — TECHNETIUM TC 99M TETROFOSMIN IV KIT
31.6000 | PACK | Freq: Once | INTRAVENOUS | Status: AC | PRN
  Administered 2023-03-20: 31.6 via INTRAVENOUS

## 2023-03-20 NOTE — Progress Notes (Signed)
Normal stress test

## 2023-04-17 ENCOUNTER — Ambulatory Visit (HOSPITAL_BASED_OUTPATIENT_CLINIC_OR_DEPARTMENT_OTHER)
Admission: RE | Admit: 2023-04-17 | Discharge: 2023-04-17 | Disposition: A | Discharge status: Home / Self Care | Payer: Self-pay | Source: Ambulatory Visit | Attending: Cardiology | Admitting: Cardiology

## 2023-04-17 DIAGNOSIS — R6889 Other general symptoms and signs: Secondary | ICD-10-CM | POA: Insufficient documentation

## 2023-04-17 DIAGNOSIS — I10 Essential (primary) hypertension: Secondary | ICD-10-CM | POA: Insufficient documentation

## 2023-04-18 NOTE — Progress Notes (Signed)
Minimal calcium seen in 1 artery.  LDL is 91 on Lipitor 10 mg daily.  Consider increasing to 20 mg daily for goal LDL <70. 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 Recommend echocardiogram now, and contrast CT angiogram in 1 year to monitor aorta size. At the current size, he should not need any intervention.  Recommend blood pressure and heart rate control.  Avoid lifting weights >40 pounds  Thanks MJP

## 2023-04-19 ENCOUNTER — Telehealth: Payer: Self-pay | Admitting: *Deleted

## 2023-04-19 DIAGNOSIS — E782 Mixed hyperlipidemia: Secondary | ICD-10-CM

## 2023-04-19 DIAGNOSIS — I77819 Aortic ectasia, unspecified site: Secondary | ICD-10-CM

## 2023-04-19 MED ORDER — ATORVASTATIN CALCIUM 20 MG PO TABS
20.0000 mg | ORAL_TABLET | Freq: Every day | ORAL | 2 refills | Status: AC
Start: 2023-04-19 — End: ?

## 2023-04-19 NOTE — Telephone Encounter (Signed)
The patient has been notified of the result and verbalized understanding.  All questions (if any) were answered.  Pt aware to increase his lipitor to 20 mg po daily.  Confirmed the pharmacy of choice with the pt.   Pt also aware we will order for him to get an echo done now, to further evaluate dilated aorta.  Pt is not wanting to have this done, unless this is cost effective.  Pt aware I will have our Echo Scheduler reach out to him to arrange and see if she could give him a rough estimate cost.   Advised him to maintain good BP control and avoid lifting weights >40 lbs, until we get precise measurements back.  Pt wants to hold off on ordering the repeat CT ANGIO AORTA in one year, for he wants to take one test at a time, due to cost issues.   Will make Dr. Rosemary Holms aware of this.   He is aware I will place the echo order now and have our Echo Scheduler reach out to him today to arrange this appt.   Pt verbalized understanding and agrees with this plan.

## 2023-04-19 NOTE — Telephone Encounter (Signed)
-----   Message from Nurse Estelle Grumbles sent at 04/18/2023  5:12 PM EST -----  ----- Message ----- From: Elder Negus, MD Sent: 04/18/2023  12:00 PM EST To: Cv [facility] Triage  Minimal calcium seen in 1 artery.  LDL is 91 on Lipitor 10 mg daily.  Consider increasing to 20 mg daily for goal LDL <70. 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 Recommend echocardiogram now, and contrast CT angiogram in 1 year to monitor aorta size. At the current size, he should not need any intervention.  Recommend blood pressure and heart rate control.  Avoid lifting weights >40 pounds  Thanks MJP

## 2023-04-19 NOTE — Telephone Encounter (Signed)
That's fine. He does not need CTA until a yar later. We can schedule later.  Thanks MJP

## 2023-04-19 NOTE — Telephone Encounter (Signed)
Mikele, [place] - 04/19/2023  9:23 AM Patwardhan, Anabel Bene, MD [phone])  Sent: Wed April 19, 2023 10:00 AM  To: Loa Socks, LPN         Message  That's fine. He does not need CTA until a yar later. We can schedule later.    Thanks  MJP

## 2023-04-20 ENCOUNTER — Ambulatory Visit (HOSPITAL_COMMUNITY): Payer: 59

## 2023-04-20 ENCOUNTER — Telehealth: Payer: Self-pay | Admitting: *Deleted

## 2023-04-20 NOTE — Telephone Encounter (Signed)
Will make Dr. Rosemary Holms aware that the pt called and cancelled his echo and is refusing to get this done at this time, due to paying off recent stress test.  He will consider scheduling in around 6 months, per our Echo Scheduler.

## 2023-04-20 NOTE — Telephone Encounter (Signed)
-----   Message from Donnie Coffin sent at 04/20/2023  7:39 AM EST ----- Regarding: FW: ECHO PER DR. PATWARDHAN I scheduled patient for tomorrow and he called back and is not able to afford to have the test and states that he will have to wait at least 6 mo in order to have due to financial issues and he is till paying for stress test.  I have cancelled test. You may wanna call patient and touch base with him. :) ----- Message ----- From: Minette Brine Sent: 04/19/2023  10:06 AM EST To: Loa Socks, LPN Subject: RE: ECHO PER DR. PATWARDHAN                    I called and LVM that we had an appt for 04/20/23. I scheduled it and asked pt to call and confirm appt. Hope he does. I also sent to the billing dept for estimate. ;) ----- Message ----- From: Loa Socks, LPN Sent: 07/04/8117   9:34 AM EST To: Minette Brine Subject: ECHO PER DR. PATWARDHAN                        Dr. 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  He is also wanting someone to give him a rough estimate of the cost of the echo.   Can you call and schedule and shoot me the date thereafter?   Thanks [employer]

## 2023-09-28 ENCOUNTER — Ambulatory Visit: Payer: Self-pay | Admitting: Cardiology

## 2023-09-28 ENCOUNTER — Ambulatory Visit (HOSPITAL_COMMUNITY)
Admission: RE | Admit: 2023-09-28 | Discharge: 2023-09-28 | Disposition: A | Discharge status: Home / Self Care | Source: Ambulatory Visit | Attending: Cardiology | Admitting: Cardiology

## 2023-09-28 DIAGNOSIS — I10 Essential (primary) hypertension: Secondary | ICD-10-CM

## 2023-09-28 DIAGNOSIS — I77819 Aortic ectasia, unspecified site: Secondary | ICD-10-CM | POA: Diagnosis present

## 2023-09-28 DIAGNOSIS — I7121 Aneurysm of the ascending aorta, without rupture: Secondary | ICD-10-CM

## 2023-09-28 LAB — ECHOCARDIOGRAM COMPLETE
Area-P 1/2: 3.31 cm2
MV M vel: 5.57 m/s
MV Peak grad: 124.1 mmHg
Radius: 0.4 cm
S' Lateral: 3.1 cm

## 2023-09-28 NOTE — Progress Notes (Signed)
 Reviewed echocardiogram and compared with recent CT scan.  Aorta sizes 4.3-4.4 cm comparable on both studies.  This is higher than what was noted on echocardiogram in 2022: 3.9 cm.  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  I do not think he needs surgery anytime soon, but important to establish care for surveillance monitoring.  Thanks MJP

## 2023-10-02 NOTE — Telephone Encounter (Signed)
 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  Will obtain CTA aorta.  Depending findings, will consider genetic counseling.  But patient has not had any family history of aortopathy, he does have children.  Recommend avoiding heavy lifting greater than 40 pounds.  Regular day-to-day physical activity is okay.  Heart rate and blood pressure are well-controlled, currently on lisinopril.  Continue the same.  Will arrange CTA aorta.  Thanks MJP

## 2023-10-02 NOTE — Telephone Encounter (Signed)
 Please review

## 2023-10-03 NOTE — Telephone Encounter (Signed)
 Pt contacted and advised that he is needing a BMP. Pt concerned about cost. Pt given associated diagnosis information to contact insurance to determine cost. Order has been placed and pt advised to go to [facility] to have completed before having CT.

## 2023-10-04 LAB — BASIC METABOLIC PANEL WITH GFR
BUN/Creatinine Ratio: 14 (ref 10–24)
BUN: 16 mg/dL (ref 8–27)
CO2: 22 mmol/L (ref 20–29)
Calcium: 9.5 mg/dL (ref 8.6–10.2)
Chloride: 102 mmol/L (ref 96–106)
Creatinine, Ser: 1.16 mg/dL (ref 0.76–1.27)
Glucose: 85 mg/dL (ref 70–99)
Potassium: 4.1 mmol/L (ref 3.5–5.2)
Sodium: 141 mmol/L (ref 134–144)
eGFR: 71 mL/min/1.73 (ref >59)

## 2023-10-18 ENCOUNTER — Ambulatory Visit (HOSPITAL_COMMUNITY)
Admission: RE | Admit: 2023-10-18 | Discharge: 2023-10-18 | Disposition: A | Discharge status: Home / Self Care | Source: Ambulatory Visit | Attending: Cardiology | Admitting: Cardiology

## 2023-10-18 DIAGNOSIS — I7121 Aneurysm of the ascending aorta, without rupture: Secondary | ICD-10-CM | POA: Insufficient documentation

## 2023-10-18 MED ORDER — IOHEXOL 350 MG/ML SOLN
75.0000 mL | Freq: Once | INTRAVENOUS | Status: AC | PRN
  Administered 2023-10-18: 75 mL via INTRAVENOUS

## 2023-11-02 ENCOUNTER — Encounter: Payer: Self-pay | Admitting: Neurology

## 2023-11-02 ENCOUNTER — Ambulatory Visit: Payer: BC Managed Care – PPO | Admitting: Neurology

## 2023-11-02 VITALS — BP 132/65 | HR 71 | Ht 72.0 in | Wt 229.0 lb

## 2023-11-02 DIAGNOSIS — G510 Bell's palsy: Secondary | ICD-10-CM | POA: Diagnosis not present

## 2023-11-02 DIAGNOSIS — E538 Deficiency of other specified B group vitamins: Secondary | ICD-10-CM

## 2023-11-02 DIAGNOSIS — H4901 Third [oculomotor] nerve palsy, right eye: Secondary | ICD-10-CM | POA: Diagnosis not present

## 2023-11-02 MED ORDER — VALACYCLOVIR HCL 500 MG PO TABS: 500.0000 mg | ORAL_TABLET | Freq: Two times a day (BID) | ORAL | 4 refills | Status: AC

## 2023-11-02 NOTE — Progress Notes (Signed)
 [facility]  PATIENT: [name] [name] DOB: Jul 07, 1960  REFERRING DOCTOR OR PCP: Lamar Bernhardt, MD (PCP); Elna Gills, MD (referring MD) SOURCE: Patient, notes from emergency room visit, laboratory results, imaging reports, MRI images personally reviewed.  _________________________________   HISTORICAL  CHIEF COMPLAINT:  Chief Complaint  Patient presents with   Follow-up    Pt in room 11. Alone. Here for Right-sided Bell's palsy.  Patient reports no change, pt is having discomfort of right upper jaw. Right eye seems to water a lot more. Dental procedure 11/07/23.    HISTORY OF PRESENT ILLNESS:  He had right Bell's Palsy August 2023.     He saw urgent care an started Valtrex  and prednisone  that evening.    He felt some tingling in the right face but no change yet in   He is on prophylactic Valtrex  and tolerates it well.  Since starting this, no further cranial nerve palsy.  Also notes no cold sores since starting (would get these about 1-2 times a year - inner lip or gums mostly)  Before the right VIIth nerve palsy, he had a partial 3rd nerve palsy, right 6th and left 4th CN palsies.   Currently he does not have diplopia  He has some tingling in fingers of the right hand.  This seems to happen mostly when he is sleeping and sometimes he wakes up with painful tingling in the right hand.  He denies any weakness  He is not diabetic.   He has HTN and hyperlipidemia.   Denies significant dry eyes or dry mouth  He takes a B12 shot monthly.     He has an ascending aortic aneurysm and is being monitored.     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   Initially he had a discomfort near the eye became more painful as the diplopia developed.   He went to the ED 10/24/20.  He had MRI orbits/brain (normal) and labs.  An LP was attempted but not completed.   He received  a single IV dose of steroid.  While in the emergency room, he was also noted to have elevated troponin and was referred to cardiology.  He saw his ophthalmologist  and urgent care who felt he had a sinus infection and started Augmentin.   Besides the diplopia and pain, he also noted reduced appetite and fatigue.  He had a 6th nerve palsy OS March 2015 (saw Dr. Arlys)  and a 4th nerve palsy OS around 2005.   An autoimmune was suspected as the ANA was positive (ENA SSB was 4.9).  HgbA1c was negative.   ESR/CRP were normal.   He saw rheumatology but no firm diagnosis was made.    In 2010 he also had pleural effusion and right hilar lymphadenopathy in his lungs and was prescribed steroids x 2  He had a fourth episode with right 7th nerve palsy October 26, 2021.    Studies: MRI brain and MRI orbits 10/24/2020 were unremarkable.  I personally reviewed the images and concur.  There is no evidence of sinusitis.    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  ANA was positive in the SSB was positive at 1.6 (it had been 4.97 years ago) SSA was negative.   \Because  of the cranial nerve palsy and the positive quantiferon TB Gold test, a lumbar puncture was performed.  White blood cells were 2, protein, glucose, HCSF SV 1 PCR, HSV 2 PCR, AFB stain, cytopathology was negative.  REVIEW OF SYSTEMS: Constitutional: No fevers, chills, sweats, or change in appetite Eyes: No visual changes, double vision, eye pain Ear, nose and throat: No hearing loss, ear pain, nasal congestion, sore throat Cardiovascular: No chest pain, palpitations Respiratory:  No shortness of breath at rest or with exertion.   No wheezes GastrointestinaI: No nausea, vomiting, diarrhea, abdominal pain, fecal incontinence Genitourinary:  No dysuria, urinary retention or frequency.  No  nocturia. Musculoskeletal:  No neck pain, back pain Integumentary: No rash, pruritus, skin lesions Neurological: as above Psychiatric: No depression at this time.  No anxiety Endocrine: No palpitations, diaphoresis, change in appetite, change in weigh or increased thirst Hematologic/Lymphatic:  No anemia, purpura, petechiae. Allergic/Immunologic: No itchy/runny eyes, nasal congestion, recent allergic reactions, rashes  ALLERGIES: Allergies  Allergen Reactions   Erythromycin     Unknown    HOME MEDICATIONS:  Current Outpatient Medications:    acetaminophen  (TYLENOL ) 500 MG tablet, Take 500 mg by mouth every 6 (six) hours as needed., Disp: , Rfl:    atorvastatin  (LIPITOR) 20 MG tablet, Take 1 tablet (20 mg total) by mouth daily., Disp: 90 tablet, Rfl: 2   carboxymethylcellulose (REFRESH PLUS) 0.5 % SOLN, 1 drop 3 (three) times daily as needed., Disp: , Rfl:    Cholecalciferol (VITAMIN D3) 50 MCG (2000 UT) capsule, Take 2,000 Units by mouth daily., Disp: , Rfl:    cyanocobalamin  (VITAMIN B12) 1000 MCG/ML injection, Inject 1 ml IM every 4 weeks.   Dispense with #13 1 cc syringes and 1 inch 25g needles, Disp: 13 mL, Rfl: 1   lisinopril (ZESTRIL) 10 MG tablet, Take 10 mg by mouth daily., Disp: , Rfl:    potassium chloride SA (KLOR-CON M15) 15 MEQ tablet, Take 15 mEq by mouth 2 (two) times daily., Disp: , Rfl:    Respiratory Therapy Supplies (CARETOUCH CPAP & BIPAP HOSE) MISC, by Does not apply route., Disp: , Rfl:    sildenafil (VIAGRA) 100 MG tablet, Take 100 mg by mouth as needed. 1/2 tablet as needed, Disp: , Rfl:    tadalafil (CIALIS) 5 MG tablet, Take 5 mg by mouth daily., Disp: , Rfl:    tiZANidine (ZANAFLEX) 2 MG tablet, Take 2-4 mg by mouth every 8 (eight) hours as needed. (Patient not taking: Reported on 11/02/2023), Disp: , Rfl:    valACYclovir  (VALTREX ) 500 MG tablet, Take 1 tablet (500 mg total) by mouth 2 (two) times daily., Disp: 180 tablet, Rfl: 4  PAST MEDICAL HISTORY: Past  Medical History:  Diagnosis Date   Hypertension     PAST SURGICAL HISTORY: Past Surgical History:  Procedure Laterality Date   cataract Bilateral      FAMILY HISTORY: Family History  Problem Relation Age of Onset   Stroke Father    High Cholesterol Father    Diabetes Father    Hypertension Maternal Grandmother    Stroke Paternal Grandfather     SOCIAL HISTORY:  Social History   Socioeconomic History   Marital status: Married    Spouse name: Asberry   Number of children: 2   Years of education: Master's   Highest education level: Not on file  Occupational History    Employer: [employer]  Tobacco Use   Smoking status: Former    Types: Cigarettes  Smokeless tobacco: Never  Vaping Use   Vaping status: Never Used  Substance and Sexual Activity   Alcohol use: No   Drug use: No   Sexual activity: Not on file  Other Topics Concern   Not on file  Social History Narrative   Patient is married to Lundy), has 2 childrenPatient is right handedEducation level is Master's degreeCaffeine consumption is 2-4 cups daily   Lives with wife   Right handed   Caffeine: 1-2 coke a day   Social Drivers of Corporate investment banker Strain: Not on file  Food Insecurity: Not on file  Transportation Needs: Not on file  Physical Activity: Not on file  Stress: Not on file  Social Connections: Unknown (08/09/2021)   [facility] from [facility]   Social Network    Social Network: Not on file  Intimate Partner Violence: Unknown (07/01/2021)   Received from [facility]   HITS    Physically Hurt: Not on file    Insult or Talk Down To: Not on file    Threaten Physical Harm: Not on file    Scream or Curse: Not on file     PHYSICAL EXAM  Vitals:   11/02/23 1538  BP: 132/65  Pulse: 71  Weight: 229 lb (103.9 kg)  Height: 6' (1.829 m)    Body mass index is 31.06 kg/m.   General: The patient is well-developed and well-nourished and in no acute  distress  HEENT:  Head is [place]/AT.  Sclera are anicteric.    Neck:  .  The neck is nontender.   Skin: Extremities are without rash or  edema.  Musculoskeletal:  Back is nontender  Neurologic Exam  Mental status: The patient is alert and oriented x 3 at the time of the examination. The patient has apparent normal recent and remote memory, with an apparently normal attention span and concentration ability.   Speech is normal.  Cranial nerves:    Extraocular muscles are normal.  He has a partial 7th nerve palsy on the right.  There is coarse reinnervation blinking on the right when he smiles and having the corner of his mouth rise when he blinks.  He reports mild numbness in the right face versus the left face.  Trapezius and sternocleidomastoid strength is normal. No dysarthria is noted.  The tongue is midline, and the patient has symmetric elevation of the soft palate. No obvious hearing deficits are noted.  Motor:  Muscle bulk is normal.   Tone is normal. Strength is  5 / 5 in all 4 extremities.   Sensory: He reports reduced sensation to temperature in the right arm and leg but symmetric vibration sensation.  Coordination: Cerebellar testing reveals good finger-nose-finger and heel-to-shin bilaterally.  Gait and station: Station is normal.   Gait is normal. Tandem gait is slightly wide. Romberg is negative.   Reflexes: Deep tendon reflexes are symmetric and normal bilaterally.       DIAGNOSTIC DATA (LABS, IMAGING, TESTING) - I reviewed patient records, labs, notes, testing and imaging myself where available.  Lab Results  Component Value Date   WBC 7.9 10/24/2020   HGB 14.9 10/24/2020   HCT 43.4 10/24/2020   MCV 88.8 10/24/2020   PLT 321 10/24/2020      Component Value Date/Time   NA 141 10/03/2023 1408   K 4.1 10/03/2023 1408   CL 102 10/03/2023 1408   CO2 22 10/03/2023 1408   GLUCOSE 85 10/03/2023 1408   GLUCOSE 114 (H)  10/24/2020 1519   BUN 16 10/03/2023 1408    CREATININE 1.16 10/03/2023 1408   CALCIUM  9.5 10/03/2023 1408   PROT 6.4 (L) 10/24/2020 1519   ALBUMIN 3.7 10/24/2020 1519   AST 22 10/24/2020 1519   ALT 23 10/24/2020 1519   ALKPHOS 105 10/24/2020 1519   BILITOT 0.8 10/24/2020 1519   GFRNONAA >60 10/24/2020 1519   GFRAA  10/21/2008 1000    >60        The eGFR has been calculated using the MDRD equation. This calculation has not been validated in all clinical situations. eGFR's persistently <60 mL/min signify possible Chronic Kidney Disease.   No results found for: CHOL, HDL, LDLCALC, LDLDIRECT, TRIG, CHOLHDL  Lab Results  Component Value Date   VITAMINB12 127 (L) 10/24/2020   Lab Results  Component Value Date   TSH 1.240 10/28/2020       ASSESSMENT AND PLAN  Right-sided Bell's palsy  Right oculomotor nerve palsy  B12 deficiency  He has had multiple cranial nerve (involving a 3rd, 4th, 6th and 7th nerve at different times) most recently he had the Bell's palsy on the right.  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     Since this is rare, the best long-term treatment is uncertain.  He will continue Valtrex  prophylaxis 500 mg twice a day.  If another cranial nerve palsy occurs we will try to quickly get him steroids.   Continue B12 shots. Unfortunately, his only had a partial recovery from the Bell's palsy and also has crossed reinnervation (motor and gland) He will return in 1 year or sooner if there are new or worsening neurologic symptoms.   Ziquan [name] A. Vear, MD, [facility] 11/02/2023, 8:42 PM Certified in Neurology, Clinical Neurophysiology, Sleep Medicine and Neuroimaging  [facility] [address], [address] [place], [place] [address] [phone]

## 2023-11-13 ENCOUNTER — Other Ambulatory Visit (HOSPITAL_COMMUNITY)

## 2023-12-17 ENCOUNTER — Other Ambulatory Visit: Payer: Self-pay | Admitting: Neurology

## 2024-02-11 ENCOUNTER — Emergency Department (HOSPITAL_COMMUNITY)

## 2024-02-11 ENCOUNTER — Telehealth: Payer: Self-pay | Admitting: Neurology

## 2024-02-11 ENCOUNTER — Emergency Department (HOSPITAL_COMMUNITY)
Admission: EM | Admit: 2024-02-11 | Discharge: 2024-02-11 | Disposition: A | Discharge status: Home / Self Care | Source: Ambulatory Visit | Attending: Emergency Medicine | Admitting: Emergency Medicine

## 2024-02-11 ENCOUNTER — Encounter (HOSPITAL_COMMUNITY): Payer: Self-pay

## 2024-02-11 ENCOUNTER — Other Ambulatory Visit: Payer: Self-pay

## 2024-02-11 DIAGNOSIS — Z79899 Other long term (current) drug therapy: Secondary | ICD-10-CM | POA: Diagnosis not present

## 2024-02-11 DIAGNOSIS — I10 Essential (primary) hypertension: Secondary | ICD-10-CM | POA: Insufficient documentation

## 2024-02-11 DIAGNOSIS — H532 Diplopia: Secondary | ICD-10-CM | POA: Diagnosis present

## 2024-02-11 DIAGNOSIS — H4921 Sixth [abducent] nerve palsy, right eye: Secondary | ICD-10-CM | POA: Diagnosis not present

## 2024-02-11 LAB — I-STAT CHEM 8, ED
BUN: 13 mg/dL (ref 8–23)
Calcium, Ion: 1.29 mmol/L (ref 1.15–1.40)
Chloride: 101 mmol/L (ref 98–111)
Creatinine, Ser: 1.2 mg/dL (ref 0.61–1.24)
Glucose, Bld: 84 mg/dL (ref 70–99)
HCT: 46 % (ref 39.0–52.0)
Hemoglobin: 15.6 g/dL (ref 13.0–17.0)
Potassium: 4.1 mmol/L (ref 3.5–5.1)
Sodium: 141 mmol/L (ref 135–145)
TCO2: 29 mmol/L (ref 22–32)

## 2024-02-11 LAB — CBC
HCT: 45.6 % (ref 39.0–52.0)
Hemoglobin: 15.1 g/dL (ref 13.0–17.0)
MCH: 30.9 pg (ref 26.0–34.0)
MCHC: 33.1 g/dL (ref 30.0–36.0)
MCV: 93.4 fL (ref 80.0–100.0)
Platelets: 293 K/uL (ref 150–400)
RBC: 4.88 MIL/uL (ref 4.22–5.81)
RDW: 12.4 % (ref 11.5–15.5)
WBC: 6.8 K/uL (ref 4.0–10.5)
nRBC: 0 % (ref 0.0–0.2)

## 2024-02-11 LAB — CBG MONITORING, ED: Glucose-Capillary: 77 mg/dL (ref 70–99)

## 2024-02-11 LAB — DIFFERENTIAL
Abs Immature Granulocytes: 0.01 K/uL (ref 0.00–0.07)
Basophils Absolute: 0 K/uL (ref 0.0–0.1)
Basophils Relative: 1 %
Eosinophils Absolute: 0.1 K/uL (ref 0.0–0.5)
Eosinophils Relative: 2 %
Immature Granulocytes: 0 %
Lymphocytes Relative: 35 %
Lymphs Abs: 2.4 K/uL (ref 0.7–4.0)
Monocytes Absolute: 0.7 K/uL (ref 0.1–1.0)
Monocytes Relative: 10 %
Neutro Abs: 3.6 K/uL (ref 1.7–7.7)
Neutrophils Relative %: 52 %

## 2024-02-11 LAB — PROTIME-INR
INR: 1 (ref 0.8–1.2)
Prothrombin Time: 13.5 s (ref 11.4–15.2)

## 2024-02-11 LAB — COMPREHENSIVE METABOLIC PANEL WITH GFR
ALT: 17 U/L (ref 0–44)
AST: 24 U/L (ref 15–41)
Albumin: 4 g/dL (ref 3.5–5.0)
Alkaline Phosphatase: 111 U/L (ref 38–126)
Anion gap: 10 (ref 5–15)
BUN: 11 mg/dL (ref 8–23)
CO2: 25 mmol/L (ref 22–32)
Calcium: 9.3 mg/dL (ref 8.9–10.3)
Chloride: 104 mmol/L (ref 98–111)
Creatinine, Ser: 1.07 mg/dL (ref 0.61–1.24)
GFR, Estimated: >60 mL/min (ref >60)
Glucose, Bld: 86 mg/dL (ref 70–99)
Potassium: 4.1 mmol/L (ref 3.5–5.1)
Sodium: 139 mmol/L (ref 135–145)
Total Bilirubin: 0.7 mg/dL (ref 0.0–1.2)
Total Protein: 7.1 g/dL (ref 6.5–8.1)

## 2024-02-11 LAB — APTT: aPTT: 32 s (ref 24–36)

## 2024-02-11 MED ORDER — PREDNISONE 20 MG PO TABS: 60.0000 mg | ORAL_TABLET | Freq: Every day | ORAL | 0 refills | Status: AC

## 2024-02-11 MED ORDER — SODIUM CHLORIDE 0.9 % IV SOLN
250.0000 mg | INTRAVENOUS | Status: AC
  Administered 2024-02-11: 250 mg via INTRAVENOUS
  Filled 2024-02-11: qty 250

## 2024-02-11 MED ORDER — GADOBUTROL 1 MMOL/ML IV SOLN
10.0000 mL | Freq: Once | INTRAVENOUS | Status: AC | PRN
  Administered 2024-02-11: 10 mL via INTRAVENOUS

## 2024-02-11 MED ORDER — SODIUM CHLORIDE 0.9% FLUSH
3.0000 mL | Freq: Once | INTRAVENOUS | Status: AC
  Administered 2024-02-11: 3 mL via INTRAVENOUS

## 2024-02-11 MED ORDER — SODIUM CHLORIDE 0.9 % IV SOLN: 250.0000 mg | Freq: Once | INTRAVENOUS | Status: DC

## 2024-02-11 NOTE — ED Triage Notes (Signed)
 Pt arrived POV at the request of his on call neurologist. Pt is c/o double vision that started yesterday. Pt states when he woke up yesterday he noticed something was off but went out with a friend and did not notice till later in the day it was true double vision.

## 2024-02-11 NOTE — ED Notes (Signed)
 AVS with prescriptions provided to and discussed with patient and family member at bedside. Pt verbalizes understanding of discharge instructions and denies any questions or concerns at this time. Pt has ride home. Pt ambulated out of department independently with steady gait.

## 2024-02-11 NOTE — ED Provider Notes (Signed)
 5:06 PM Patient signed out to me by previous ED physician. Pt is a 63 yo male with pmh of frequent nerve palsy's presenting for diplopia and decreased left eye movement.    Mri normal  Awaiting call back from [facility]patrick-recs on steriod dosing  Physical Exam  BP 126/78 (BP Location: Right Arm)   Pulse 76   Temp 98 F (36.7 C)   Resp 17   Ht 6' (1.829 m)   Wt 99.8 kg   SpO2 99%   BMI 29.84 kg/m   Physical Exam  Procedures  Procedures  ED Course / MDM    Medical Decision Making  Dr. Michaela going to give solumdrol 250mg  now. Plan for prednisone  60 mg x 7 days and call est neuro team for follow up tomorrow.   Patient in no distress and overall condition improved here in the ED. Detailed discussions were had with the patient regarding current findings, and need for close f/u with PCP or on call doctor. The patient has been instructed to return immediately if the symptoms worsen in any way for re-evaluation. Patient verbalized understanding and is in agreement with current care plan. All questions answered prior to discharge.        Elnor Bernarda SQUIBB, DO 02/11/24 1730

## 2024-02-11 NOTE — Discharge Instructions (Signed)
 Prednisone  60 mg x 7 days sent to your pharmacy.  Please follow-up with your established neurology team.

## 2024-02-11 NOTE — Telephone Encounter (Signed)
 Late entry. Phone call on 9/15 at 519 PM Patient called the on call service complaining of double vision. He reports working in the yard earlier that day and feels that he might have something in his eyes. He reports a history of multiple cranial nerve palsy in the past. Tells me that he has some steroid at home but does not want to start since he is schedule for cataract surgery in 10 days. Advise him to first wash his eye with eye solution and to call back in the morning if no improvement.   Dr. Seirra Kos

## 2024-02-11 NOTE — Telephone Encounter (Signed)
 Late entry 9/16 at 10 AM  Spoke with patient, still complaining of double vision. Tells me that he felt better after washing his eyes last night but double vision is still present this morning. Advised him to go to the ED but he wants to take care of it outpatient, feels the ED might do a battery of tests that he cannot afford.  Advised him that I will inform Dr. Vear and hopefully he can be seen in the office this week. Advised him to to the ED if symptoms worsening or if he has additional neurological symptoms. He voiced understanding.   Dr. Javari Bufkin

## 2024-02-11 NOTE — ED Provider Triage Note (Addendum)
 Emergency Medicine Provider Triage Evaluation Note  [name] [name] , a 63 y.o. male  was evaluated in triage.  Pt complains of double vision.  Patient reports when 1 eye is closed he does not have double vision probably has it with both eyes.  Has no headache or systemic symptoms.  Patient reports that he has a history of different cranial nerve palsies and sees neurology for this.  He reports that he thinks is abnormal Saturday morning around 0730.  Denies any trouble walking or talking.  Denies any eye pain.  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  He was encouraged come in from his neurologist..  Review of Systems  Positive:  Negative:   Physical Exam  BP 126/78 (BP Location: Right Arm)   Pulse 76   Temp 98 F (36.7 C)   Resp 17   Ht 6' (1.829 m)   Wt 99.8 kg   SpO2 99%   BMI 29.84 kg/m  Gen:   Awake, no distress   Resp:  Normal effort  MSK:   Moves extremities without difficulty  Other:  Slight right sided facial droop which patient reports is chronic from his Bell's palsy.  EOMI.  PERRLA.  Visual fields intact  Medical Decision Making  Medically screening exam initiated at 12:55 PM.  Appropriate orders placed.  Samiel Peel was informed that the remainder of the evaluation will be completed by another provider, this initial triage assessment does not replace that evaluation, and the importance of remaining in the ED until their evaluation is complete.  Consult to neurology placed.  1:28 PM spoke with Dr. Michaela. MRI brain and orbits with and without contrast. Will give recommendations when imaging results.    Bernis Ernst, PA-C 02/11/24 1257    Bernis Ernst, PA-C 02/11/24 1328

## 2024-02-11 NOTE — ED Provider Notes (Signed)
 [place] EMERGENCY DEPARTMENT AT [facility] Provider Note   CSN: [id number] Arrival date & time: 02/11/24  1140     Patient presents with: Diplopia   [name] [name] is a 63 y.o. male.   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  Pt presents to the ED today with diplopia.  He said if he closes either eye sx resolve.  He sees objects side by side.  He has no other neuro deficits.  He's already taking Valtrex  chronically 500 mg BID.       Prior to Admission medications   Medication Sig Start Date End Date Taking? Authorizing Provider  acetaminophen  (TYLENOL ) 500 MG tablet Take 500 mg by mouth every 6 (six) hours as needed.    [provider]  atorvastatin  (LIPITOR) 20 MG tablet Take 1 tablet (20 mg total) by mouth daily. 04/19/23   Patwardhan, Newman PARAS, MD  BD ECLIPSE SYRINGE 25G X 1 3 ML MISC USE TO INJECT EVERY 4 WEEKS AS DIRECTED 12/18/23   Lomax, Amy, NP  carboxymethylcellulose (REFRESH PLUS) 0.5 % SOLN 1 drop 3 (three) times daily as needed.    [provider]  Cholecalciferol (VITAMIN D3) 50 MCG (2000 UT) capsule Take 2,000 Units by mouth daily.    [provider]  cyanocobalamin  (VITAMIN B12) 1000 MCG/ML injection INJECT IN THE MUSCLE EVERY 4 WEEKS 12/18/23   Lomax, Amy, NP  cyanocobalamin  (VITAMIN B12) 1000 MCG/ML injection INJECT 1ML IN THE MUSCLE EVERY 4 WEEKS 12/18/23   Sater, Charlie LABOR, MD  lisinopril (ZESTRIL) 10 MG tablet Take 10 mg by mouth daily. 04/20/20   [provider]  potassium chloride SA (KLOR-CON M15) 15 MEQ tablet Take 15 mEq by mouth 2 (two) times daily.    [provider]  Respiratory Therapy Supplies (CARETOUCH CPAP & BIPAP HOSE) MISC by Does not apply route.    [provider]  sildenafil (VIAGRA) 100 MG tablet Take 100 mg by mouth as needed. 1/2 tablet as needed 08/06/22    [provider]  tadalafil (CIALIS) 5 MG tablet Take 5 mg by mouth daily. 09/02/23   [provider]  tiZANidine (ZANAFLEX) 2 MG tablet Take 2-4 mg by mouth every 8 (eight) hours as needed. Patient not taking: Reported on 11/02/2023 11/08/21   [provider]  valACYclovir  (VALTREX ) 500 MG tablet Take 1 tablet (500 mg total) by mouth 2 (two) times daily. 11/02/23   Sater, Charlie LABOR, MD    Allergies: Erythromycin    Review of Systems  Eyes:  Positive for visual disturbance.  All other systems reviewed and are negative.   Updated Vital Signs BP 126/78 (BP Location: Right Arm)   Pulse 76   Temp 98 F (36.7 C)   Resp 17   Ht 6' (1.829 m)   Wt 99.8 kg   SpO2 99%   BMI 29.84 kg/m   Physical Exam Vitals and nursing note reviewed.  Constitutional:      Appearance: Normal appearance. He is obese.  HENT:     Head: Normocephalic and atraumatic.     Right Ear: External ear normal.     Left Ear: External ear normal.     Nose: Nose normal.     Mouth/Throat:     Mouth: Mucous membranes are moist.     Pharynx: Oropharynx is clear.  Eyes:     Extraocular Movements: Extraocular movements intact.  Conjunctiva/sclera: Conjunctivae normal.     Pupils: Pupils are equal, round, and reactive to light.  Cardiovascular:     Rate and Rhythm: Normal rate and regular rhythm.     Pulses: Normal pulses.     Heart sounds: Normal heart sounds.  Pulmonary:     Effort: Pulmonary effort is normal.     Breath sounds: Normal breath sounds.  Abdominal:     General: Abdomen is flat. Bowel sounds are normal.     Palpations: Abdomen is soft.  Musculoskeletal:        General: Normal range of motion.     Cervical back: Normal range of motion and neck supple.  Skin:    General: Skin is warm.     Capillary Refill: Capillary refill takes less than 2 seconds.  Neurological:     General: No focal deficit present.     Mental Status: He is alert and oriented to person, place, and  time.  Psychiatric:        Mood and Affect: Mood normal.        Behavior: Behavior normal.     (all labs ordered are listed, but only abnormal results are displayed) Labs Reviewed  CBC  DIFFERENTIAL  COMPREHENSIVE METABOLIC PANEL WITH GFR  PROTIME-INR  APTT  I-STAT CHEM 8, ED  CBG MONITORING, ED    EKG: None  Radiology: MR ORBITS W WO CONTRAST Result Date: 02/11/2024 EXAM: MRI ORBITS WITHOUT AND WITH CONTRAST 02/11/2024 03:50:00 PM TECHNIQUE: Multiplanar, multisequence MRI of the orbits was performed without and with intravenous contrast. 10 mL gadobutrol  (GADAVIST ) 1 MMOL/ML injection was administered. COMPARISON: MR orbits without and with contrast dated 10/24/2020. CLINICAL HISTORY: Diplopia. FINDINGS: ORBITS: Normal globes. Left lens replacement is noted. Lenses are normally located. Symmetric caliber and signal of the optic nerves. Symmetric extraocular muscles. No orbital mass. No abnormal enhancement. SINUSES AND MASTOIDS: Mild mucosal thickening is present within the maxillary sinuses bilaterally. The remaining visualized paranasal sinuses and mastoid air cells are clear. BRAIN: No acute abnormality. BONES AND SOFT TISSUES: Normal bone marrow signal. No acute soft tissue abnormality. IMPRESSION: 1. No acute orbital abnormality to explain diplopia. 2. Mild maxillary sinus mucosal thickening bilaterally. Electronically signed by: Lonni Necessary MD 02/11/2024 04:14 PM EST RP Workstation: [id number]   MR Brain W and Wo Contrast Result Date: 02/11/2024 EXAM: MRI BRAIN WITH AND WITHOUT CONTRAST 02/11/2024 03:43:00 PM TECHNIQUE: Multiplanar multisequence MRI of the head/brain was performed with and without the administration of intravenous contrast. COMPARISON: MR head without and with contrast and MR orbits without and with contrast 10/24/2020. CLINICAL HISTORY: Diplopia. FINDINGS: BRAIN AND VENTRICLES: No acute infarct. No acute intracranial hemorrhage. No mass effect or midline  shift. No hydrocephalus. The sella is unremarkable. Normal flow voids. No mass or abnormal enhancement. ORBITS: No acute abnormality. SINUSES: No acute abnormality. BONES AND SOFT TISSUES: Normal bone marrow signal and enhancement. No acute soft tissue abnormality. IMPRESSION: 1. No acute intracranial abnormality. 2. No mass or abnormal enhancement. Electronically signed by: Lonni Necessary MD 02/11/2024 04:09 PM EST RP Workstation: [id number]     Procedures   Medications Ordered in the ED  sodium chloride  flush (NS) 0.9 % injection 3 mL (3 mLs Intravenous Given 02/11/24 1308)  gadobutrol  (GADAVIST ) 1 MMOL/ML injection 10 mL (10 mLs Intravenous Contrast Given 02/11/24 1544)  Medical Decision Making  This patient presents to the ED for concern of diplopia, this involves an extensive number of treatment options, and is a complaint that carries with it a high risk of complications and morbidity.  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  The pertinent results include:  cbc nl, istat neg; cmp nl; inr nl   Imaging Studies ordered:  I ordered imaging studies including mri brain and orbits  I independently visualized and interpreted imaging which showed  MRI brain: No acute intracranial abnormality.  2. No mass or abnormal enhancement.  MRI orbits: No acute orbital abnormality to explain diplopia.  2. Mild maxillary sinus mucosal thickening bilaterally.   I agree with the radiologist interpretation   Cardiac Monitoring:  The patient was maintained on  a cardiac monitor.  I personally viewed and interpreted the cardiac monitored which showed an underlying rhythm of: nsr   Medicines ordered and prescription drug management:   I have reviewed the patients home medicines and have made adjustments as needed   Test Considered:  mri   Critical Interventions:  mri   Consultations Obtained:  I requested consultation with the neurologist (Dr. Michaela),  and discussed lab and imaging findings as well as pertinent plan - he is in the middle of a code stroke and will lmk his recommendation shortly.   Problem List / ED Course:  diplopia   Reevaluation:  After the interventions noted above, I reevaluated the patient and found that they have :improved   Social Determinants of Health:  Lives at home   Dispostion:  Pending at shift change     Final diagnoses:  Diplopia    ED Discharge Orders     None          [name] Clarity, MD 02/11/24 1707

## 2024-02-11 NOTE — Consult Note (Signed)
 NEUROLOGY CONSULT NOTE   Date of service: February 11, 2024 Patient Name: [name] [name] MRN:  [id number] DOB:  1960/07/06 Chief Complaint: Double vision Requesting Provider: Elnor Bernarda SQUIBB, DO  History of Present Illness  [name] [name] is a 63 y.o. male with hx of idiopathic recurrent cranial polyneuropathy who presents with diplopia started yesterday.  He has had right sixth, partial third, left fourth, as well as 7th nerve palsies in the past.  Due to a developing diplopia he presented to the emergency department for evaluation.  He denies severe headache, but does have some discomfort around his eye.  He denies numbness, weakness, new unsteadiness, or other new symptoms.   Past History   Past Medical History:  Diagnosis Date   Hypertension     Past Surgical History:  Procedure Laterality Date   cataract Bilateral    RETINAL DETACHMENT SURGERY Bilateral    nov 2024    Family History: Family History  Problem Relation Age of Onset   Stroke Father    High Cholesterol Father    Diabetes Father    Hypertension Maternal Grandmother    Stroke Paternal Grandfather     Social History  reports that he has quit smoking. His smoking use included cigarettes. He has never used smokeless tobacco. He reports that he does not drink alcohol and does not use drugs.  Allergies  Allergen Reactions   Erythromycin     Unknown    Medications   Current Facility-Administered Medications:    methylPREDNISolone  sodium succinate (SOLU-MEDROL ) 250 mg in sodium chloride  0.9 % 50 mL IVPB, 250 mg, Intravenous, STAT, Haji Delaine, Aisha SQUIBB, MD  Current Outpatient Medications:    acetaminophen  (TYLENOL ) 500 MG tablet, Take 500 mg by mouth every 6 (six) hours as needed., Disp: , Rfl:    atorvastatin  (LIPITOR) 20 MG tablet, Take 1 tablet (20 mg total) by mouth daily., Disp: 90 tablet, Rfl: 2   BD ECLIPSE SYRINGE 25G X 1 3 ML MISC, USE TO INJECT EVERY 4 WEEKS AS DIRECTED, Disp: 13 each, Rfl:  0   carboxymethylcellulose (REFRESH PLUS) 0.5 % SOLN, 1 drop 3 (three) times daily as needed., Disp: , Rfl:    Cholecalciferol (VITAMIN D3) 50 MCG (2000 UT) capsule, Take 2,000 Units by mouth daily., Disp: , Rfl:    cyanocobalamin  (VITAMIN B12) 1000 MCG/ML injection, INJECT IN THE MUSCLE EVERY 4 WEEKS, Disp: 13 mL, Rfl: 1   cyanocobalamin  (VITAMIN B12) 1000 MCG/ML injection, INJECT IN THE MUSCLE EVERY 4 WEEKS, Disp: 13 mL, Rfl: 1   lisinopril (ZESTRIL) 10 MG tablet, Take 10 mg by mouth daily., Disp: , Rfl:    potassium chloride SA (KLOR-CON M15) 15 MEQ tablet, Take 15 mEq by mouth 2 (two) times daily., Disp: , Rfl:    Respiratory Therapy Supplies (CARETOUCH CPAP & BIPAP HOSE) MISC, by Does not apply route., Disp: , Rfl:    sildenafil (VIAGRA) 100 MG tablet, Take 100 mg by mouth as needed. 1/2 tablet as needed, Disp: , Rfl:    tadalafil (CIALIS) 5 MG tablet, Take 5 mg by mouth daily., Disp: , Rfl:    tiZANidine (ZANAFLEX) 2 MG tablet, Take 2-4 mg by mouth every 8 (eight) hours as needed. (Patient not taking: Reported on 11/02/2023), Disp: , Rfl:    valACYclovir  (VALTREX ) 500 MG tablet, Take 1 tablet (500 mg total) by mouth 2 (two) times daily., Disp: 180 tablet, Rfl: 4  Vitals   Vitals:   02/11/24 1145 02/11/24 1221  BP: 126/78  Pulse: 76   Resp: 17   Temp: 98 F (36.7 C)   SpO2: 99%   Weight:  99.8 kg  Height:  6' (1.829 m)    Body mass index is 29.84 kg/m.   Physical Exam   Constitutional: Appears well-developed and well-nourished.  Neurologic Examination    Neuro: Mental Status: Patient is awake, alert, oriented to person, place, month, year, and situation. Patient is able to give a clear and coherent history. No signs of aphasia or neglect Cranial Nerves: II: Visual Fields are full. Pupils are equal, round, and reactive to light.   III,IV, VI: He has a right partial 6th nerve palsy V: Facial sensation is symmetric to temperature VII: Facial movement is  symmetric.  VIII: hearing is intact to voice X: Uvula elevates symmetrically XII: tongue is midline without atrophy or fasciculations.  Motor: Tone is normal. Bulk is normal. 5/5 strength was present in all four extremities.  Sensory: Sensation is symmetric to light touchin  the arms and legs. Cerebellar: FNF intact bilaterally        Labs/Imaging/Neurodiagnostic studies   CBC:  Recent Labs  Lab 03/11/2024 1308 03-11-24 1313  WBC 6.8  --   NEUTROABS 3.6  --   HGB 15.1 15.6  HCT 45.6 46.0  MCV 93.4  --   PLT 293  --    Basic Metabolic Panel:  Lab Results  Component Value Date   NA 141 March 11, 2024   K 4.1 03-11-24   CO2 25 11-Mar-2024   GLUCOSE 84 03-11-2024   BUN 13 2024-03-11   CREATININE 1.20 03-11-2024   CALCIUM  9.3 03-11-24   GFRNONAA >60 2024-03-11   GFRAA  10/21/2008    >60        The eGFR has been calculated using the MDRD equation. This calculation has not been validated in all clinical situations. eGFR's persistently <60 mL/min signify possible Chronic Kidney Disease.   Lipid Panel: No results found for: LDLCALC HgbA1c:  Lab Results  Component Value Date   HGBA1C 5.6 06/06/2013   Urine Drug Screen: No results found for: LABOPIA, COCAINSCRNUR, LABBENZ, AMPHETMU, THCU, LABBARB  Alcohol Level No results found for: [facility] INR  Lab Results  Component Value Date   INR 1.0 03/11/24   APTT  Lab Results  Component Value Date   APTT 32 2024/03/11   MRI brain and orbits with and without contrast  ASSESSMENT   [name] [name] is a 63 y.o. male with idiopathic recurrent cranial polyneuropathies who presents with a new partial 6th nerve palsy on the right.  Treatment for this condition is fairly poorly defined, but has agreed to consist of steroids.  Some proposed treatment protocols include Solu-Medrol  1 g daily for 3 to 5 days, prednisolone 30 to 60 mg daily, but I do feel that steroids are indicated.  Given his symptoms are on  the milder side, I do not think I would favor aggressive high-dose intravenous steroids, but a single dose to begin treatment I think would be reasonable.  RECOMMENDATIONS  Solu-Medrol  250 mg x 1 Follow this with prednisone  60 mg daily, and he will call his regular neurologist for further guidance. ______________________________________________________________________    Signed, Aisha Seals, MD [facility]

## 2024-02-12 ENCOUNTER — Other Ambulatory Visit: Payer: Self-pay | Admitting: Neurology

## 2024-02-12 ENCOUNTER — Telehealth: Payer: Self-pay | Admitting: Neurology

## 2024-02-12 ENCOUNTER — Telehealth: Payer: Self-pay

## 2024-02-12 MED ORDER — METHYLPREDNISOLONE 4 MG PO TABS: ORAL_TABLET | ORAL | 0 refills | Status: AC

## 2024-02-12 NOTE — Telephone Encounter (Signed)
 This is a patient of Dr. Vear. Please check with Dr. Vear when he would like to see him or if any additional steroids are needed.

## 2024-02-12 NOTE — Telephone Encounter (Signed)
 Pt has called to inform that he was given a prednisone  pack, he wants to know if Dr Vear wants him to start that or not.  Pt asked it be mentioned that he is scheduled for cataract surgery on 11/25, he'd like to know if Dr Vear gives the ok for that or not.

## 2024-02-12 NOTE — Telephone Encounter (Signed)
Dr. Felecia Shelling- what do you recommend?

## 2024-02-12 NOTE — Telephone Encounter (Signed)
 He was seen in the emergency room for a 6th nerve palsy.  This is at least the fourth time he has had a cranial nerve palsy.  Of note, he was taking prophylactic Valtrex  500 mg twice daily when this occurred.  The ED prescribed prednisone  60 mg for 7 days.  He would just slightly modified this to make day 7 at 40 mg and day 8 at 20 mg Additionally, I have sent in a Medrol  pack for him to have at the house and when he travels that he could begin to take if he has new neurologic symptoms. For 7 days increase the Valtrex  to 1000 mg twice daily I cannot answer a question he had about whether he should proceed with cataract surgery scheduled on Wednesday or not.  I advised him to talk to his ophthalmology practice as they would need to make the call whether to proceed or not

## 2024-02-12 NOTE — Telephone Encounter (Signed)
-----   Message from Charlie DELENA Crete sent at 02/12/2024  8:41 AM EST ----- Regarding: FW: cranial neuropathy patient. If he is not better, please see if we can get him worked into the schedule ----- Message ----- From: Michaela Aisha SQUIBB, MD Sent: 02/11/2024  10:04 PM EST To: Charlie DELENA Crete, MD Subject: cranial neuropathy patient.                    I saw [name] [name] in the ER, given a single dose of IV Solu-Medrol  and sent him home with some prednisone , I was not sure about the duration of the course, but gave him a week.  He will be calling your office.

## 2024-02-12 NOTE — Telephone Encounter (Signed)
 Pt called to follow up about ER visit Pt wanted to know did MD Get messages from   yesterday  and what was MD Thoughts the patient stated that He had went to Er as directed to  and wanted to follow up

## 2024-10-10 ENCOUNTER — Ambulatory Visit: Admitting: Neurology
